# Patient Record
Sex: Female | Born: 1990 | Race: White | Hispanic: No | Marital: Married | Smoking: Never smoker
Health system: Southern US, Community
[De-identification: ages and names within clinical notes are randomized; demographics above are authoritative.]

## PROBLEM LIST (undated history)

## (undated) DIAGNOSIS — N946 Dysmenorrhea, unspecified: Secondary | ICD-10-CM

## (undated) DIAGNOSIS — J45909 Unspecified asthma, uncomplicated: Secondary | ICD-10-CM

## (undated) DIAGNOSIS — N939 Abnormal uterine and vaginal bleeding, unspecified: Secondary | ICD-10-CM

## (undated) DIAGNOSIS — E039 Hypothyroidism, unspecified: Secondary | ICD-10-CM

## (undated) HISTORY — DX: Abnormal uterine and vaginal bleeding, unspecified: N93.9

## (undated) HISTORY — DX: Dysmenorrhea, unspecified: N94.6

## (undated) HISTORY — DX: Unspecified asthma, uncomplicated: J45.909

## (undated) HISTORY — PX: OTHER SURGICAL HISTORY: SHX169

---
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
## 2013-01-25 ENCOUNTER — Encounter: Payer: Self-pay | Admitting: Obstetrics & Gynecology

## 2013-01-25 ENCOUNTER — Ambulatory Visit (INDEPENDENT_AMBULATORY_CARE_PROVIDER_SITE_OTHER): Payer: 59 | Admitting: Obstetrics & Gynecology

## 2013-01-25 VITALS — BP 106/76 | HR 60 | Resp 16 | Ht 68.5 in | Wt 211.8 lb

## 2013-01-25 DIAGNOSIS — Z01419 Encounter for gynecological examination (general) (routine) without abnormal findings: Secondary | ICD-10-CM

## 2013-01-25 DIAGNOSIS — Z124 Encounter for screening for malignant neoplasm of cervix: Secondary | ICD-10-CM

## 2013-01-25 DIAGNOSIS — Z0000 Encounter for general adult medical examination without abnormal findings: Secondary | ICD-10-CM

## 2013-01-25 LAB — POCT URINALYSIS DIPSTICK
Bilirubin, UA: NEGATIVE
Glucose, UA: NEGATIVE
Ketones, UA: NEGATIVE
Leukocytes, UA: NEGATIVE
Nitrite, UA: NEGATIVE
Protein, UA: NEGATIVE
pH, UA: 5

## 2013-01-25 LAB — HEMOGLOBIN, FINGERSTICK: Hemoglobin, fingerstick: 13.8 g/dL (ref 12.0–16.0)

## 2013-01-25 MED ORDER — NORETHINDRONE ACET-ETHINYL EST 1-20 MG-MCG PO TABS: 1.0000 | ORAL_TABLET | Freq: Every day | ORAL | Status: DC

## 2013-01-25 NOTE — Patient Instructions (Signed)

## 2013-01-25 NOTE — Progress Notes (Signed)
22 y.o. G0P0000 SingleCaucasianF here for annual exam.  Has seen Dr. Oliva Bustard years ago--when she was in her teens.  Patient is sexually active and using condoms but she wants to start something for birth control.  Patient's mother had ovarian cysts and fibroids and patient would like to be on ocps in case she has some of the same issues.  Cycles are heavy.  Always have been.  Usually 7-9 days.  Heavy first 3-4 days.  Has clots usually days 1-4.  Uses super tampons.  Uses pads and tampons on heavy days and has to change every 2 hours.  Has been years since she has been on a pill.    Dating same person for 1 1/2 years--only sex partner.  Discussing getting married.  Do want children but she wants to wait several years.    Reports got new job this year.  Felt a lot more stress this year and has gained weight.  Working on this now with diet and exercise.   Patient's last menstrual period was 12/23/2012.          Sexually active: yes  The current method of family planning is condoms most of the time.    Exercising: yes  cardio and weights Smoker:  no  Health Maintenance: Pap:  No history History of abnormal Pap:  no MMG:  none Colonoscopy:  none BMD:   none TDaP:  Up to date Screening Labs: none, Hb today: 13.8, Urine today: negative   reports that she has never smoked. She has never used smokeless tobacco. She reports that she drinks about 0.5 ounces of alcohol per week. She reports that she does not use illicit drugs.  Past Medical History  Diagnosis Date  . Asthma     as a child/situational now    Past Surgical History  Procedure Laterality Date  . Bunionectomy      left  . Tonsillectomy  1998    Current Outpatient Prescriptions  Medication Sig Dispense Refill  . ibuprofen (ADVIL,MOTRIN) 200 MG tablet Take 200 mg by mouth every 6 (six) hours as needed for pain.      . Triamcinolone Acetonide (NASACORT AQ NA) Place into the nose as needed.       No current  facility-administered medications for this visit.    Family History  Problem Relation Age of Onset  . Bipolar disorder Mother   . Bipolar disorder Brother   . Diabetes Maternal Grandmother     prediabetic  . Diabetes Paternal Grandfather   . Fibroids Mother     embedded fibroids and ovarian cyst  . Endometriosis Maternal Grandmother   . Prostate cancer Paternal Grandfather   . Heart disease Paternal Grandfather   . Breast cancer Other     maternal great grandmother  . Thyroid disease Mother   . Hypertension Mother     ?    ROS:  Pertinent items are noted in HPI.  Otherwise, a comprehensive ROS was negative.  Exam:   BP 106/76  Pulse 60  Resp 16  Ht 5' 8.5" (1.74 m)  Wt 211 lb 12.8 oz (96.072 kg)  BMI 31.73 kg/m2  LMP 12/23/2012    Height: 5' 8.5" (174 cm)  Ht Readings from Last 3 Encounters:  01/25/13 5' 8.5" (1.74 m)    General appearance: alert, cooperative and appears stated age Head: Normocephalic, without obvious abnormality, atraumatic Neck: no adenopathy, supple, symmetrical, trachea midline and thyroid normal to inspection and palpation Lungs: clear to auscultation  bilaterally Breasts: normal appearance, no masses or tenderness Heart: regular rate and rhythm Abdomen: soft, non-tender; bowel sounds normal; no masses,  no organomegaly Extremities: extremities normal, atraumatic, no cyanosis or edema Skin: Skin color, texture, turgor normal. No rashes or lesions Lymph nodes: Cervical, supraclavicular, and axillary nodes normal. No abnormal inguinal nodes palpated Neurologic: Grossly normal   Pelvic: External genitalia:  no lesions              Urethra:  normal appearing urethra with no masses, tenderness or lesions              Bartholins and Skenes: normal                 Vagina: normal appearing vagina with normal color and discharge, no lesions              Cervix: no lesions              Pap taken: yes Bimanual Exam:  Uterus:  normal size, contour,  position, consistency, mobility, non-tender              Adnexa: normal adnexa and no mass, fullness, tenderness               Rectovaginal: Confirms               Anus:  normal sphincter tone, no lesions  A:  Well Woman with normal exam Menorrhagia Desires starting OCPs  P:   Mammogram starting at age 54 pap smear only today TSH Will start junel 1/20.  Rx to pharmacy for three months supply.  Side effects of headache, nausea, elevated BP, DVT/PE all discussed. Recheck 3 months after starting ocps. return annually or prn  An After Visit Summary was printed and given to the patient.

## 2013-01-26 LAB — IPS PAP TEST WITH REFLEX TO HPV

## 2013-02-21 ENCOUNTER — Telehealth: Payer: Self-pay | Admitting: Obstetrics & Gynecology

## 2013-02-21 NOTE — Telephone Encounter (Signed)
Patient states has had cycle 6-7 days of normal cycle to wear a tampon and now a brownish color of discharge enough to wear a panty liner. Patient concerned is this pill strong enough for her? Patient also concerned the pills she was given only had 21days of pills and no sugar pills like she had thought you said would have 7 days of pills. Asking if she could continue to start a new pack of 21 days and not wait for the 7 days to start a new pack of pills?

## 2013-02-21 NOTE — Telephone Encounter (Signed)
Patient is experiencing side effects from her new birth control that she started a month ago. She has been bleeding for 12 days. Wants to know if she should continue use? She is currently taking Gildess 120.

## 2013-02-21 NOTE — Telephone Encounter (Signed)
Patient notified of Dr. Hyacinth Meeker response and patient voiced understanding of this.

## 2013-02-21 NOTE — Telephone Encounter (Signed)
Some generics are packaged the way she described--21 active days only.  She is supposed to then stop for 7 days.  21 days of pills/7 days off or of placebo--whatever is in the pack.  She should not run the packs together yet as we have not discussed doing this.  It takes about three months to adjust to a new pill.  She should have follow up in three months.  If bleeding is still irregular, I will change her pills then.

## 2013-05-06 ENCOUNTER — Other Ambulatory Visit: Payer: Self-pay | Admitting: *Deleted

## 2013-05-06 ENCOUNTER — Ambulatory Visit (INDEPENDENT_AMBULATORY_CARE_PROVIDER_SITE_OTHER): Payer: 59 | Admitting: Obstetrics & Gynecology

## 2013-05-06 VITALS — BP 118/70 | HR 60 | Resp 16 | Ht 68.25 in | Wt 217.8 lb

## 2013-05-06 DIAGNOSIS — N938 Other specified abnormal uterine and vaginal bleeding: Secondary | ICD-10-CM

## 2013-05-06 DIAGNOSIS — N920 Excessive and frequent menstruation with regular cycle: Secondary | ICD-10-CM

## 2013-05-06 DIAGNOSIS — N949 Unspecified condition associated with female genital organs and menstrual cycle: Secondary | ICD-10-CM

## 2013-05-06 MED ORDER — MISOPROSTOL 200 MCG PO TABS: ORAL_TABLET | ORAL | Status: DC

## 2013-05-06 NOTE — Patient Instructions (Addendum)
Arrive about 10-15 minutes before appointment

## 2013-05-06 NOTE — Progress Notes (Signed)
22 y.o. Single Caucasian female G0P0000 here for follow up after starting Loestrin 1/20.  She had 19 days of bleeding with the first two months.  The third month, her cycle was normal until she missed one pill and then she started having bleeding and is still bleeding.  It is not heavy but completely annoying for her.  She is frustrated by this and has three questions:  Is she on the correct hormone dose?  What about an IUD?  Her mother has hx of fibroids so she she be concerned?  She is engaged and is getting married in March.  She does want good birth control and is not contemplating pregnancy for several years.    We discussed several options.  D/W pt that cycle control is ususally estrogen related so increasing dosage is appropriate.  Skyla and Mirena IUDs discussed including placement, risks/benefits, length of time of use, cycle improvement.  Pt's cycle are regular off OCPs but heavy so progesterone IUD would be good choice.  Also discussed doing PUS prior to U/S placement to evaluate for fibroids.  Pt interested in IUD and PUS.  She will do some research but she thinks IUD is best option for her.  O: Healthy WD,WN female Affect: normal No physical exam today  Assessment:  DUB on OCPs, desires change in [facility] choice Family hx of fibroids  P: Pt will stop pills now and have withdrawal cycle Plan PUS next week with Mirena/Skyla placement Pt will use cytotec pm of day before and am day of procedure.  Plan paracervical block as well.  Rx to pharmacy.  ~15 minutes spent with patient >50% of time was in face to face discussion of above.

## 2013-05-08 ENCOUNTER — Encounter: Payer: Self-pay | Admitting: Obstetrics & Gynecology

## 2013-05-08 DIAGNOSIS — N938 Other specified abnormal uterine and vaginal bleeding: Secondary | ICD-10-CM | POA: Insufficient documentation

## 2013-05-10 ENCOUNTER — Ambulatory Visit (INDEPENDENT_AMBULATORY_CARE_PROVIDER_SITE_OTHER): Payer: 59 | Admitting: Obstetrics & Gynecology

## 2013-05-10 ENCOUNTER — Ambulatory Visit (INDEPENDENT_AMBULATORY_CARE_PROVIDER_SITE_OTHER): Payer: 59

## 2013-05-10 ENCOUNTER — Other Ambulatory Visit: Payer: Self-pay | Admitting: *Deleted

## 2013-05-10 ENCOUNTER — Other Ambulatory Visit: Payer: Self-pay | Admitting: Obstetrics & Gynecology

## 2013-05-10 DIAGNOSIS — Z30431 Encounter for routine checking of intrauterine contraceptive device: Secondary | ICD-10-CM

## 2013-05-10 DIAGNOSIS — N938 Other specified abnormal uterine and vaginal bleeding: Secondary | ICD-10-CM

## 2013-05-10 DIAGNOSIS — N949 Unspecified condition associated with female genital organs and menstrual cycle: Secondary | ICD-10-CM

## 2013-05-10 DIAGNOSIS — N925 Other specified irregular menstruation: Secondary | ICD-10-CM

## 2013-05-10 DIAGNOSIS — Z309 Encounter for contraceptive management, unspecified: Secondary | ICD-10-CM

## 2013-05-10 DIAGNOSIS — Z30430 Encounter for insertion of intrauterine contraceptive device: Secondary | ICD-10-CM

## 2013-05-10 DIAGNOSIS — IMO0001 Reserved for inherently not codable concepts without codable children: Secondary | ICD-10-CM

## 2013-05-10 NOTE — Progress Notes (Signed)
22 y.o. G0 SWF here for a pelvic ultrasound due to her concerns about family hx of fibroids as well as significant DUB after starting OCPs.  Planning IUD placement today if ultrasound is normal.     Patient's last menstrual period was 05/02/2013.  Sexually active:  yes  Contraception: oral contraceptives (estrogen/progesterone)  FINDINGS: UTERUS: 6.9 x 3.2 x 3.0cm.  No fibroids. EMS: 3.75mm ADNEXA:   Left ovary 2.8 x 1.5 x.1.4cm   Right ovary 3.6 x 2.4 x 2.0cm CUL DE SAC: neg  Reviewed images and results with pt.  As ultrasound is normal, feel IUD placement appropriate.  Pt would like to have the [facility] IUD as this one will be more likely for her to continue to cycle normally.  Consent obtained.  Risks/benefits discussed.    Procedure:  Speculum placed.  Cervix cleansed with Betadine x 3.  Paracervical block with 1% Lidocaine placed.  10 cc's total used.  Anterior lip of cervix grasped with single toothed tenaculum.  Uterus sounded to 6.5cm.  Skyla IUD with introducer passed to fundus and then pulled back slightly.  IUD released and pushed to fundus.  Introducer removed.  Strings cut to 2.0cm.  Tenaculum removed.  Minimal bleeding noted.  Pt tolerated procedure well.  Reviewed MR conditional status with patient and gave her card provided in package.    Proper placement confirmed with utlrasound.  Assessment:  DUB with OCPs, s/p Skyla placement today. Plan: recheck 6 weeks for IUD recheck  ~15 minutes spent with patient >50% of time was in face to face discussion of ultrasound results and options before proceeding with IUD placement.

## 2013-05-11 ENCOUNTER — Encounter: Payer: Self-pay | Admitting: Obstetrics & Gynecology

## 2013-05-11 NOTE — Patient Instructions (Signed)

## 2013-06-21 ENCOUNTER — Encounter: Payer: Self-pay | Admitting: Obstetrics & Gynecology

## 2013-06-21 ENCOUNTER — Ambulatory Visit (INDEPENDENT_AMBULATORY_CARE_PROVIDER_SITE_OTHER): Payer: 59 | Admitting: Obstetrics & Gynecology

## 2013-06-21 VITALS — BP 120/80 | HR 78 | Resp 16 | Ht 68.5 in | Wt 224.6 lb

## 2013-06-21 DIAGNOSIS — Z30431 Encounter for routine checking of intrauterine contraceptive device: Secondary | ICD-10-CM

## 2013-06-21 MED ORDER — PHENTERMINE HCL 15 MG PO CAPS: 15.0000 mg | ORAL_CAPSULE | ORAL | Status: DC

## 2013-06-21 NOTE — Progress Notes (Signed)
22 yrs Caucasian Single G0P0000 here for IUD recheck.  Spotted for about daily after IUD placement.  Cycle was 05/30/13 and lasted 7 days.  This is pretty normal for patient.  No spotting since.  Fiance cannot feel string.  Pt reports no pain with intercourse.  Patient reports history of binge eating d/o about four years ago.  She has never purged.  She has seen a counselor and she hasn't done this in several years. Worked on weight loss and exercise and got down to 165.  She is very happy with boyfriend/fiance.  Knows she is eating out more than before.  She is exercising again.  Has gained over 60 pounds in over 2 years.  TSH normal in 6/14--in EPIC.  HPI neg.  Exam:  Gen:  WNWD, WF, NAD Abdomen: soft non-tender Groin:no inguinal nodes palpated    Pelvic exam:Pelvic exam: normal external genitalia, vulva, vagina, cervix, uterus and adnexa, CERVIX: normal appearing cervix without discharge or lesions, 2cm IUD string noted.  No CMT.   Assessment: IUD recheck Obesity, desires weight loss  Plan: Phentermine 15mg  daily.  D/w pt risks of hypertension, headache, insomnia, pulmonary hypertension Recheck 4 weeks  ~15 minutes spent with patient >50% of time was in face to face discussion of above.

## 2013-06-23 ENCOUNTER — Other Ambulatory Visit: Payer: Self-pay

## 2013-07-19 ENCOUNTER — Encounter: Payer: Self-pay | Admitting: Obstetrics & Gynecology

## 2013-07-19 ENCOUNTER — Telehealth: Payer: Self-pay | Admitting: Obstetrics & Gynecology

## 2013-07-21 NOTE — Telephone Encounter (Signed)
Left patient a message that I will check with Dr Hyacinth Meeker and get RX called in, but she will need to call back to schedule f/u with Dr Hyacinth Meeker sometime in December.

## 2013-07-21 NOTE — Telephone Encounter (Signed)
Pt states she talked with Dr. Hyacinth Meeker on my chart and was told a pres would be called into the pharmacy two days ago.

## 2013-07-29 ENCOUNTER — Telehealth: Payer: Self-pay

## 2013-07-29 NOTE — Telephone Encounter (Signed)
Message copied by Elisha Headland on Fri Jul 29, 2013  4:18 PM ------      Message from: Jerene Bears      Created: Tue Jul 19, 2013 10:37 PM      Regarding: rx and appt       Tresa Endo,      She needs a weight check/phentermine check sometime in December.  We also need to order her phentermine again.  I am sending you this so you will remind me.  Thanks.            MSM ------

## 2013-07-29 NOTE — Telephone Encounter (Signed)
lmtcb

## 2013-08-17 ENCOUNTER — Other Ambulatory Visit: Payer: Self-pay

## 2013-08-17 MED ORDER — PHENTERMINE HCL 15 MG PO CAPS: 15.0000 mg | ORAL_CAPSULE | ORAL | Status: DC

## 2013-08-17 NOTE — Telephone Encounter (Signed)
Pharmacy states no refill called on 07/21/13-called in today-patient to call back to schedule f/u in 1/15//kn

## 2013-10-28 ENCOUNTER — Telehealth: Payer: Self-pay | Admitting: Obstetrics & Gynecology

## 2013-10-28 NOTE — Telephone Encounter (Signed)
LMTCB. VM confirms first name.

## 2013-10-28 NOTE — Telephone Encounter (Signed)
Patient reports menses was a few days late and lighter than normal.  Has had Skyla for 6 months and is concerned if this is normal.  Reaassured this is normal and [facility] and that cycles may continue to become even lighter to absent.  Patient reports she continues to have random menstrual type cramping and is also concerned if this is normal.  Advised that it is normal as long as nothing more than Motrin can help.  Due to patients concerns, offered OV and patient would like to be seen.  Getting married in 2 weeks and would like to confirm that IUD is in place.  Reports increased vaginal discharge with slight yellow tint and some odor. Denies itching or burning.  OV sched for 10-31-13 with Dr Hyacinth MeekerMiller.  Routing to provider for final review. Patient agreeable to disposition. Will close encounter

## 2013-10-28 NOTE — Telephone Encounter (Signed)
Patient request an appointment to have her iud checked.

## 2013-10-31 ENCOUNTER — Telehealth: Payer: Self-pay | Admitting: Obstetrics & Gynecology

## 2013-10-31 ENCOUNTER — Ambulatory Visit: Payer: 59 | Admitting: Obstetrics & Gynecology

## 2013-10-31 NOTE — Telephone Encounter (Signed)
Patient cancelled her IUD recheck appointment for this afternoon due to being called into work. She rescheduled to 11/04/13 at 8:45 AM.

## 2013-11-04 ENCOUNTER — Encounter: Payer: Self-pay | Admitting: Obstetrics & Gynecology

## 2013-11-04 ENCOUNTER — Ambulatory Visit (INDEPENDENT_AMBULATORY_CARE_PROVIDER_SITE_OTHER): Payer: 59 | Admitting: Obstetrics & Gynecology

## 2013-11-04 ENCOUNTER — Telehealth: Payer: Self-pay | Admitting: Obstetrics & Gynecology

## 2013-11-04 VITALS — BP 113/80 | HR 74 | Resp 18 | Ht 68.5 in | Wt 231.0 lb

## 2013-11-04 DIAGNOSIS — N898 Other specified noninflammatory disorders of vagina: Secondary | ICD-10-CM

## 2013-11-04 DIAGNOSIS — Z30431 Encounter for routine checking of intrauterine contraceptive device: Secondary | ICD-10-CM

## 2013-11-04 MED ORDER — CLINDAMYCIN HCL 300 MG PO CAPS: 300.0000 mg | ORAL_CAPSULE | Freq: Two times a day (BID) | ORAL | Status: DC

## 2013-11-04 MED ORDER — CLINDAMYCIN HCL 300 MG PO CAPS: 300.0000 mg | ORAL_CAPSULE | Freq: Three times a day (TID) | ORAL | Status: DC

## 2013-11-04 NOTE — Progress Notes (Signed)
Subjective:     Patient ID: Felicia Burns, female   DOB: 05/15/1991, 23 y.o.   MRN: [id number]  HPI 23 yo G0 engaged WF here for evaluation of yellowish discharge.  Feels like this is worse since IUD placement.  Having to wear a panty liner because of the discharge.  There is some slight odor.  No pain.  No fevers.  Cycles are much lighter with the IUD.  Flow flashes a little longer.  Total bleeding is 10 days.  Can't feel her strings.  She is getting married next week so just wants to be checked.  She occasionally has a cramp but this is random and doesn't last.    Review of Systems  All other systems reviewed and are negative.       Objective:   Physical Exam  Constitutional: She is oriented to person, place, and time. She appears well-developed and well-nourished.  Abdominal: Soft. Bowel sounds are normal. She exhibits no distension. There is no tenderness.  Genitourinary: Uterus normal. There is no rash, tenderness or lesion on the right labia. There is no rash, tenderness or lesion on the left labia. Cervix exhibits no motion tenderness, no discharge and no friability. Right adnexum displays no mass and no tenderness. Left adnexum displays no mass and no tenderness. No tenderness or bleeding around the vagina. Vaginal discharge found.  2cm IUD string noted.  Small amount of dark vaginal bleeding.  Wet smear obtained.  PH 5.0.  Saline with ++RBCs, +WBCs, no trich, occ clue cells.  KOH +Whiff, -yeast.  Musculoskeletal: Normal range of motion.  Lymphadenopathy:       Right: No inguinal adenopathy present.       Left: No inguinal adenopathy present.  Neurological: She is alert and oriented to person, place, and time.  Skin: Skin is warm and dry.  Psychiatric: She has a normal mood and affect.       Assessment:     IUD recheck Bacterial vaginosis     Plan:     clindmycin 300mg  bid x 7 days.  rx [facility]

## 2013-11-04 NOTE — Telephone Encounter (Signed)
Order refaxed

## 2013-11-04 NOTE — Telephone Encounter (Signed)
[facility] calling to get clarification on clindamycin (CLEOCIN) 300 MG capsule

## 2013-11-04 NOTE — Telephone Encounter (Signed)
Take 1 capsule (300 mg total) by mouth 2 (two) times daily. Take one capsule TID x 7D?

## 2013-11-04 NOTE — Telephone Encounter (Signed)
[facility] calling to get clarification on

## 2013-12-28 ENCOUNTER — Encounter: Payer: Self-pay | Admitting: Obstetrics & Gynecology

## 2014-02-06 NOTE — Telephone Encounter (Signed)
Patient has had follow up appointment.//kn

## 2014-03-10 ENCOUNTER — Ambulatory Visit: Payer: 59 | Admitting: Obstetrics & Gynecology

## 2014-03-17 ENCOUNTER — Ambulatory Visit: Payer: 59 | Admitting: Obstetrics & Gynecology

## 2014-03-17 ENCOUNTER — Encounter: Payer: Self-pay | Admitting: Obstetrics & Gynecology

## 2014-06-27 ENCOUNTER — Encounter: Payer: Self-pay | Admitting: Certified Nurse Midwife

## 2014-06-27 ENCOUNTER — Ambulatory Visit (INDEPENDENT_AMBULATORY_CARE_PROVIDER_SITE_OTHER): Payer: Commercial Managed Care - PPO | Admitting: Certified Nurse Midwife

## 2014-06-27 VITALS — BP 104/64 | HR 68 | Resp 16 | Ht 67.75 in | Wt 258.0 lb

## 2014-06-27 DIAGNOSIS — Z0000 Encounter for general adult medical examination without abnormal findings: Secondary | ICD-10-CM

## 2014-06-27 DIAGNOSIS — Z01419 Encounter for gynecological examination (general) (routine) without abnormal findings: Secondary | ICD-10-CM

## 2014-06-27 LAB — POCT URINALYSIS DIPSTICK
BILIRUBIN UA: NEGATIVE
Glucose, UA: NEGATIVE
KETONES UA: NEGATIVE
LEUKOCYTES UA: NEGATIVE
NITRITE UA: NEGATIVE
PH UA: 5
PROTEIN UA: NEGATIVE
RBC UA: NEGATIVE
Urobilinogen, UA: NEGATIVE

## 2014-06-27 LAB — HEMOGLOBIN, FINGERSTICK: HEMOGLOBIN, FINGERSTICK: 13.2 g/dL (ref 12.0–16.0)

## 2014-06-27 NOTE — Patient Instructions (Signed)
General topics  Next pap or exam is  due in 1 year Take a Women's multivitamin Take 1200 mg. of calcium daily - prefer dietary If any concerns in interim to call back  Breast Self-Awareness Practicing breast self-awareness may pick up problems early, prevent significant medical complications, and possibly save your life. By practicing breast self-awareness, you can become familiar with how your breasts look and feel and if your breasts are changing. This allows you to notice changes early. It can also offer you some reassurance that your breast health is good. One way to learn what is normal for your breasts and whether your breasts are changing is to do a breast self-exam. If you find a lump or something that was not present in the past, it is best to contact your caregiver right away. Other findings that should be evaluated by your caregiver include nipple discharge, especially if it is bloody; skin changes or reddening; areas where the skin seems to be pulled in (retracted); or new lumps and bumps. Breast pain is seldom associated with cancer (malignancy), but should also be evaluated by a caregiver. BREAST SELF-EXAM The best time to examine your breasts is 5 7 days after your menstrual period is over.  ExitCare Patient Information 2013 [employer].   Exercise to Stay Healthy Exercise helps you become and stay healthy. EXERCISE IDEAS AND TIPS Choose exercises that:  You enjoy.  Fit into your day. You do not need to exercise really hard to be healthy. You can do exercises at a slow or medium level and stay healthy. You can:  Stretch before and after working out.  Try yoga, Pilates, or tai chi.  Lift weights.  Walk fast, swim, jog, run, climb stairs, bicycle, dance, or rollerskate.  Take aerobic classes. Exercises that burn about 150 calories:  Running 1  miles in 15 minutes.  Playing volleyball for 45 to 60 minutes.  Washing and waxing a car for 45 to 60  minutes.  Playing touch football for 45 minutes.  Walking 1  miles in 35 minutes.  Pushing a stroller 1  miles in 30 minutes.  Playing basketball for 30 minutes.  Raking leaves for 30 minutes.  Bicycling 5 miles in 30 minutes.  Walking 2 miles in 30 minutes.  Dancing for 30 minutes.  Shoveling snow for 15 minutes.  Swimming laps for 20 minutes.  Walking up stairs for 15 minutes.  Bicycling 4 miles in 15 minutes.  Gardening for 30 to 45 minutes.  Jumping rope for 15 minutes.  Washing windows or floors for 45 to 60 minutes. Document Released: 09/06/2010 Document Revised: 10/27/2011 Document Reviewed: 09/06/2010 ExitCare Patient Information 2013 ExitCare[employer].   Other topics ( that may be useful information):    Sexually Transmitted Disease Sexually transmitted disease (STD) refers to any infection that is passed from person to person during sexual activity. This may happen by way of saliva, semen, blood, vaginal mucus, or urine. Common STDs include:  Gonorrhea.  Chlamydia.  Syphilis.  HIV/AIDS.  Genital herpes.  Hepatitis B and C.  Trichomonas.  Human papillomavirus (HPV).  Pubic lice. CAUSES  An STD may be spread by bacteria, virus, or parasite. A person can get an STD by:  Sexual intercourse with an infected person.  Sharing sex toys with an infected person.  Sharing needles with an infected person.  Having intimate contact with the genitals, mouth, or rectal areas of an infected person. SYMPTOMS  Some people may not have any symptoms, but   they can still pass the infection to others. Different STDs have different symptoms. Symptoms include:  Painful or bloody urination.  Pain in the pelvis, abdomen, vagina, anus, throat, or eyes.  Skin rash, itching, irritation, growths, or sores (lesions). These usually occur in the genital or anal area.  Abnormal vaginal discharge.  Penile discharge in men.  Soft, flesh-colored skin growths in the  genital or anal area.  Fever.  Pain or bleeding during sexual intercourse.  Swollen glands in the groin area.  Yellow skin and eyes (jaundice). This is seen with hepatitis. DIAGNOSIS  To make a diagnosis, your caregiver may:  Take a medical history.  Perform a physical exam.  Take a specimen (culture) to be examined.  Examine a sample of discharge under a microscope.  Perform blood test TREATMENT   Chlamydia, gonorrhea, trichomonas, and syphilis can be cured with antibiotic medicine.  Genital herpes, hepatitis, and HIV can be treated, but not cured, with prescribed medicines. The medicines will lessen the symptoms.  Genital warts from HPV can be treated with medicine or by freezing, burning (electrocautery), or surgery. Warts may come back.  HPV is a virus and cannot be cured with medicine or surgery.However, abnormal areas may be followed very closely by your caregiver and may be removed from the cervix, vagina, or vulva through office procedures or surgery. If your diagnosis is confirmed, your recent sexual partners need treatment. This is true even if they are symptom-free or have a negative culture or evaluation. They should not have sex until their caregiver says it is okay. HOME CARE INSTRUCTIONS  All sexual partners should be informed, tested, and treated for all STDs.  Take your antibiotics as directed. Finish them even if you start to feel better.  Only take over-the-counter or prescription medicines for pain, discomfort, or fever as directed by your caregiver.  Rest.  Eat a balanced diet and drink enough fluids to keep your urine clear or pale yellow.  Do not have sex until treatment is completed and you have followed up with your caregiver. STDs should be checked after treatment.  Keep all follow-up appointments, Pap tests, and blood tests as directed by your caregiver.  Only use latex condoms and water-soluble lubricants during sexual activity. Do not use  petroleum jelly or oils.  Avoid alcohol and illegal drugs.  Get vaccinated for HPV and hepatitis. If you have not received these vaccines in the past, talk to your caregiver about whether one or both might be right for you.  Avoid risky sex practices that can break the skin. The only way to avoid getting an STD is to avoid all sexual activity.Latex condoms and dental dams (for oral sex) will help lessen the risk of getting an STD, but will not completely eliminate the risk. SEEK MEDICAL CARE IF:   You have a fever.  You have any new or worsening symptoms. Document Released: 10/25/2002 Document Revised: 10/27/2011 Document Reviewed: 11/01/2010 [facility] Patient Information 2013 [place].    Domestic Abuse You are being battered or abused if someone close to you hits, pushes, or physically hurts you in any way. You also are being abused if you are forced into activities. You are being sexually abused if you are forced to have sexual contact of any kind. You are being emotionally abused if you are made to feel worthless or if you are constantly threatened. It is important to remember that help is available. No one has the right to abuse you. PREVENTION OF FURTHER  ABUSE  Learn the warning signs of danger. This varies with situations but may include: the use of alcohol, threats, isolation from friends and family, or forced sexual contact. Leave if you feel that violence is going to occur.  If you are attacked or beaten, report it to the police so the abuse is documented. You do not have to press charges. The police can protect you while you or the attackers are leaving. Get the officer's name and badge number and a copy of the report.  Find someone you can trust and tell them what is happening to you: your caregiver, a nurse, clergy member, close friend or family member. Feeling ashamed is natural, but remember that you have done nothing wrong. No one deserves abuse. Document Released:  08/01/2000 Document Revised: 10/27/2011 Document Reviewed: 10/10/2010 ExitCare Patient Information 2013 ExitCare[employer].    How Much is Too Much Alcohol? Drinking too much alcohol can cause injury, accidents, and health problems. These types of problems can include:   Car crashes.  Falls.  Family fighting (domestic violence).  Drowning.  Fights.  Injuries.  Burns.  Damage to certain organs.  Having a baby with birth defects. ONE DRINK CAN BE TOO MUCH WHEN YOU ARE:  Working.  Pregnant or breastfeeding.  Taking medicines. Ask your doctor.  Driving or planning to drive. If you or someone you know has a drinking problem, get help from a doctor.  Document Released: 05/31/2009 Document Revised: 10/27/2011 Document Reviewed: 05/31/2009 ExitCare Patient Information 2013 ExitCare[employer].   Smoking Hazards Smoking cigarettes is extremely bad for your health. Tobacco smoke has over 200 known poisons in it. There are over 60 chemicals in tobacco smoke that cause cancer. Some of the chemicals found in cigarette smoke include:   Cyanide.  Benzene.  Formaldehyde.  Methanol (wood alcohol).  Acetylene (fuel used in welding torches).  Ammonia. Cigarette smoke also contains the poisonous gases nitrogen oxide and carbon monoxide.  Cigarette smokers have an increased risk of many serious medical problems and Smoking causes approximately:  90% of all lung cancer deaths in men.  80% of all lung cancer deaths in women.  90% of deaths from chronic obstructive lung disease. Compared with nonsmokers, smoking increases the risk of:  Coronary heart disease by 2 to 4 times.  Stroke by 2 to 4 times.  Men developing lung cancer by 23 times.  Women developing lung cancer by 13 times.  Dying from chronic obstructive lung diseases by 12 times.  . Smoking is the most preventable cause of death and disease in our society.  WHY IS SMOKING ADDICTIVE?  Nicotine is the chemical  agent in tobacco that is capable of causing addiction or dependence.  When you smoke and inhale, nicotine is absorbed rapidly into the bloodstream through your lungs. Nicotine absorbed through the lungs is capable of creating a powerful addiction. Both inhaled and non-inhaled nicotine may be addictive.  Addiction studies of cigarettes and spit tobacco show that addiction to nicotine occurs mainly during the teen years, when young people begin using tobacco products. WHAT ARE THE BENEFITS OF QUITTING?  There are many health benefits to quitting smoking.   Likelihood of developing cancer and heart disease decreases. Health improvements are seen almost immediately.  Blood pressure, pulse rate, and breathing patterns start returning to normal soon after quitting. QUITTING SMOKING   American Lung Association - 1-800-LUNGUSA  American Cancer Society - 1-800-ACS-2345 Document Released: 09/11/2004 Document Revised: 10/27/2011 Document Reviewed: 05/16/2009 ExitCare Patient Information 2013 ExitCare,   LLC.   Stress Management Stress is a state of physical or mental tension that often results from changes in your life or normal routine. Some common causes of stress are:  Death of a loved one.  Injuries or severe illnesses.  Getting fired or changing jobs.  Moving into a new home. Other causes may be:  Sexual problems.  Business or financial losses.  Taking on a large debt.  Regular conflict with someone at home or at work.  Constant tiredness from lack of sleep. It is not just bad things that are stressful. It may be stressful to:  Win the lottery.  Get married.  Buy a new car. The amount of stress that can be easily tolerated varies from person to person. Changes generally cause stress, regardless of the types of change. Too much stress can affect your health. It may lead to physical or emotional problems. Too little stress (boredom) may also become stressful. SUGGESTIONS TO  REDUCE STRESS:  Talk things over with your family and friends. It often is helpful to share your concerns and worries. If you feel your problem is serious, you may want to get help from a professional counselor.  Consider your problems one at a time instead of lumping them all together. Trying to take care of everything at once may seem impossible. List all the things you need to do and then start with the most important one. Set a goal to accomplish 2 or 3 things each day. If you expect to do too many in a single day you will naturally fail, causing you to feel even more stressed.  Do not use alcohol or drugs to relieve stress. Although you may feel better for a short time, they do not remove the problems that caused the stress. They can also be habit forming.  Exercise regularly - at least 3 times per week. Physical exercise can help to relieve that "uptight" feeling and will relax you.  The shortest distance between despair and hope is often a good night's sleep.  Go to bed and get up on time allowing yourself time for appointments without being rushed.  Take a short "time-out" period from any stressful situation that occurs during the day. Close your eyes and take some deep breaths. Starting with the muscles in your face, tense them, hold it for a few seconds, then relax. Repeat this with the muscles in your neck, shoulders, hand, stomach, back and legs.  Take good care of yourself. Eat a balanced diet and get plenty of rest.  Schedule time for having fun. Take a break from your daily routine to relax. HOME CARE INSTRUCTIONS   Call if you feel overwhelmed by your problems and feel you can no longer manage them on your own.  Return immediately if you feel like hurting yourself or someone else. Document Released: 01/28/2001 Document Revised: 10/27/2011 Document Reviewed: 09/20/2007 [facility] Patient Information 2013 [place].  Exercise to Lose Weight Exercise and a healthy diet  may help you lose weight. Your doctor may suggest specific exercises. EXERCISE IDEAS AND TIPS  Choose low-cost things you enjoy doing, such as walking, bicycling, or exercising to workout videos.  Take stairs instead of the elevator.  Walk during your lunch break.  Park your car further away from work or school.  Go to a gym or an exercise class.  Start with 5 to 10 minutes of exercise each day. Build up to 30 minutes of exercise 4 to 6 days a week.  Wear shoes with good support and comfortable clothes.  Stretch before and after working out.  Work out until you breathe harder and your heart beats faster.  Drink extra water when you exercise.  Do not do so much that you hurt yourself, feel dizzy, or get very short of breath. Exercises that burn about 150 calories:  Running 1  miles in 15 minutes.  Playing volleyball for 45 to 60 minutes.  Washing and waxing a car for 45 to 60 minutes.  Playing touch football for 45 minutes.  Walking 1  miles in 35 minutes.  Pushing a stroller 1  miles in 30 minutes.  Playing basketball for 30 minutes.  Raking leaves for 30 minutes.  Bicycling 5 miles in 30 minutes.  Walking 2 miles in 30 minutes.  Dancing for 30 minutes.  Shoveling snow for 15 minutes.  Swimming laps for 20 minutes.  Walking up stairs for 15 minutes.  Bicycling 4 miles in 15 minutes.  Gardening for 30 to 45 minutes.  Jumping rope for 15 minutes.  Washing windows or floors for 45 to 60 minutes. Document Released: 09/06/2010 Document Revised: 10/27/2011 Document Reviewed: 09/06/2010 [facility] Patient Information 2015 [place], [place]. This information is not intended to replace advice given to you by your health care provider. Make sure you discuss any questions you have with your health care provider.

## 2014-06-27 NOTE — Progress Notes (Signed)
Reviewed personally.  M. Suzanne Kaelie Henigan, MD.  

## 2014-06-27 NOTE — Progress Notes (Signed)
23 y.o. G0P0000 Single Caucasian Fe here for annual exam. Periods normal, no issues with lighter flow. Contraception Skyla IUD working well, with out problems. Patient interested in weight loss. Working with Juice plus shakes which is helping. Patient does not want to be like the rest of the family who had gastric bypass to lose weight. She has lost over a hundred pounds before and feels she can do it again without medication. Jogging with spouse 2 x weekly and plans to increase. Sees PCP prn. No other health issues today.  Patient's last menstrual period was 06/26/2014.          Sexually active: Yes.    The current method of family planning is IUD.    Exercising: Yes.    cardio Smoker:  no  Health Maintenance: Pap: 01-25-13 neg MMG:  none Colonoscopy:  none BMD:   none TDaP:  2008 Labs: Poct urine-neg, Hgb-13.2 Self breast exam: done occ   reports that she has never smoked. She has never used smokeless tobacco. She reports that she drinks about 1.8 oz of alcohol per week. She reports that she does not use illicit drugs.  Past Medical History  Diagnosis Date  . Asthma     as a child/situational now    Past Surgical History  Procedure Laterality Date  . Bunionectomy  2002    left  . Tonsillectomy  1998    Current Outpatient Prescriptions  Medication Sig Dispense Refill  . fluticasone (FLONASE) 50 MCG/ACT nasal spray as needed.    Marland Kitchen. ibuprofen (ADVIL,MOTRIN) 200 MG tablet Take 200 mg by mouth every 6 (six) hours as needed for pain.    . Levonorgestrel (SKYLA) 13.5 MG IUD by Intrauterine route.     No current facility-administered medications for this visit.    Family History  Problem Relation Age of Onset  . Bipolar disorder Mother   . Bipolar disorder Brother   . Diabetes Maternal Grandmother     prediabetic  . Diabetes Paternal Grandfather   . Fibroids Mother     embedded fibroids and ovarian cyst  . Endometriosis Maternal Grandmother   . Prostate cancer Paternal  Grandfather   . Heart disease Paternal Grandfather   . Breast cancer Other     maternal great grandmother  . Thyroid disease Mother   . Hypertension Mother     ?    ROS:  Pertinent items are noted in HPI.  Otherwise, a comprehensive ROS was negative.  Exam:   BP 104/64 mmHg  Pulse 68  Resp 16  Ht 5' 7.75" (1.721 m)  Wt 258 lb (117.028 kg)  BMI 39.51 kg/m2  LMP 06/26/2014 Height: 5' 7.75" (172.1 cm)  Ht Readings from Last 3 Encounters:  06/27/14 5' 7.75" (1.721 m)  11/04/13 5' 8.5" (1.74 m)  06/21/13 5' 8.5" (1.74 m)    General appearance: alert, cooperative and appears stated age Head: Normocephalic, without obvious abnormality, atraumatic Neck: no adenopathy, supple, symmetrical, trachea midline and thyroid normal to inspection and palpation Lungs: clear to auscultation bilaterally Breasts: normal appearance, no masses or tenderness, No nipple retraction or dimpling, No nipple discharge or bleeding, No axillary or supraclavicular adenopathy Heart: regular rate and rhythm Abdomen: soft, non-tender; no masses,  no organomegaly Extremities: extremities normal, atraumatic, no cyanosis or edema Skin: Skin color, texture, turgor normal. No rashes or lesions Lymph nodes: Cervical, supraclavicular, and axillary nodes normal. No abnormal inguinal nodes palpated Neurologic: Grossly normal   Pelvic: External genitalia:  no lesions  Urethra:  normal appearing urethra with no masses, tenderness or lesions              Bartholin's and Skene's: normal                 Vagina: normal appearing vagina with normal color and discharge, no lesions              Cervix: non tender normal, no lesions IUD string noted in cervix              Pap taken: Yes.   Bimanual Exam:  Uterus:  normal size, contour, position, consistency, mobility, non-tender and anteverted              Adnexa: normal adnexa and no mass, fullness, tenderness               Rectovaginal: Confirms                Anus:  Normal appearance  A:  Well Woman with normal exam  Contraception Skyla IUD  Obese  P:   Reviewed health and wellness pertinent to exam  Reviewed warning signs of IUD and need to advise  Discussed weight loss options and encouraged to work with weight by eating heavier meal in am and decreasing the evening meal. Eliminate all white in diet and work on complex carbohydrates and protein. Consider work out [employer] with Psychologist, educationaltrainer.Encouraged to seek spouse support. Patient feels she can do this. Encouraged to keep in touch with us with progress or if help needed. Encouraged to continue Juice plus shakes for in between snacks which should help.  Pap smear not taken today  counseled on breast self exam, adequate intake of calcium and vitamin D, diet and exercise return annually or prn  An After Visit Summary was printed and given to the patient.

## 2015-02-18 ENCOUNTER — Telehealth: Payer: Self-pay | Admitting: Obstetrics and Gynecology

## 2015-02-18 NOTE — Telephone Encounter (Signed)
Phone call returned to patient on February 18, 2015.  [facility] and calling with vulvar numbness.  Has Skyla IUD inserted September 2014.  Symptoms started in perianal area a few days ago. Pinches her vulvar area and cannot feel the sensation well.  Denies pain.  States she has some decreased sensation over her shins but not on her thighs.  Has been walking a lot. No frank falls. Some pressure in her head but not really headaches.  No change in her speech.  Urinary stress incontinence she attributes to her extra weight.  No loss of bowel control.  I recommended patient call the office on February 20, 2015 if symptoms persist, and we can see her for a visit and then refer to neurology as appropriate. She states she does not have a PCP or know of one.  If symptoms progress, go to local emergency department.  Patient indicates understanding and agrees.

## 2015-03-07 ENCOUNTER — Telehealth: Payer: Self-pay | Admitting: Certified Nurse Midwife

## 2015-03-07 NOTE — Telephone Encounter (Signed)
Patient calling requesting to schedule IUD removal and a consultation for birth control. She has new insurance and will call later with the information.

## 2015-03-07 NOTE — Telephone Encounter (Signed)
Return call to patient, left message to call back. 

## 2015-03-19 NOTE — Telephone Encounter (Signed)
Follow-up call to patient. Left message to call back. Can speak to any triage nurse.

## 2015-03-28 NOTE — Telephone Encounter (Signed)
Message left to return call to Felicia Burns at [phone].    

## 2015-04-02 NOTE — Telephone Encounter (Signed)
Dr.Miller,  We have attempted to reach patient three times without response.  Okay to close encounter?

## 2015-04-02 NOTE — Telephone Encounter (Signed)
OK to close encounter. Thanks.

## 2015-05-09 ENCOUNTER — Telehealth: Payer: Self-pay | Admitting: Certified Nurse Midwife

## 2015-05-09 DIAGNOSIS — Z30432 Encounter for removal of intrauterine contraceptive device: Secondary | ICD-10-CM

## 2015-05-09 NOTE — Addendum Note (Signed)
Addended by: Jannet Askew on: 05/09/2015 04:31 PM   Modules accepted: Orders

## 2015-05-09 NOTE — Telephone Encounter (Signed)
Spoke with patient. Patient state that she would like to have her IUD removed this week. "I have had it for 2 years and I keep having changes with my ph. I also feel like it is making me gain weight even though they say that it cant. I have gained 60 pounds since having it and have been dieting and exercising without results. I just want to have it out to see if it makes a difference." Appointment scheduled for tomorrow at 2 pm with Leota Sauers CNM. Agreeable to date and time. States that she has new insurance coverage. Asked patient to fax new insurance card to office for pre-certification. Patient states she does not have accessibility to a fax machine. Offered to move appointment until this information can be obtained as this will determine her cost. Patient declines. Will try to fax prior to appointment. Advised I will also send a message to billing to see if there is something else that can be done to obtain information.   Cc: Ellis Savage  Routing to provider for final review. Patient agreeable to disposition. Will close encounter.

## 2015-05-09 NOTE — Telephone Encounter (Signed)
Patient would like to have her iud removed. °

## 2015-05-10 ENCOUNTER — Encounter: Payer: Self-pay | Admitting: Certified Nurse Midwife

## 2015-05-10 ENCOUNTER — Ambulatory Visit (INDEPENDENT_AMBULATORY_CARE_PROVIDER_SITE_OTHER): Payer: BLUE CROSS/BLUE SHIELD | Admitting: Certified Nurse Midwife

## 2015-05-10 DIAGNOSIS — Z30432 Encounter for removal of intrauterine contraceptive device: Secondary | ICD-10-CM | POA: Diagnosis not present

## 2015-05-10 NOTE — Patient Instructions (Signed)
Contraception Choices  Birth control (contraception) is the use of any methods or devices to stop pregnancy from happening. Below are some methods to help avoid pregnancy.  HORMONAL BIRTH CONTROL  · A small tube put under the skin of the upper arm (implant). The tube can stay in place for 3 years. The implant must be taken out after 3 years.  · Shots given every 3 months.  · Pills taken every day.  · Patches that are changed once a week.  · A ring put into the vagina (vaginal ring). The ring is left in place for 3 weeks and removed for 1 week. Then, a new ring is put in the vagina.  · Emergency birth control pills taken after unprotected sex (intercourse).  BARRIER BIRTH CONTROL   · A thin covering worn on the penis (female condom) during sex.  · A soft, loose covering put into the vagina (female condom) before sex.  · A rubber bowl that sits over the cervix (diaphragm). The bowl must be made for you. The bowl is put into the vagina before sex. The bowl is left in place for 6 to 8 hours after sex.  · A small, soft cup that fits over the cervix (cervical cap). The cup must be made for you. The cup can be left in place for 48 hours after sex.  · A sponge that is put into the vagina before sex.  · A chemical that kills or stops sperm from getting into the cervix and uterus (spermicide). The chemical may be a cream, jelly, foam, or pill.  INTRAUTERINE (IUD) BIRTH CONTROL   · IUD birth control is a small, T-shaped piece of plastic. The plastic is put inside the uterus. There are 2 types of IUD:  ¨ Copper IUD. The IUD is covered in copper wire. The copper makes a fluid that kills sperm. It can stay in place for 10 years.  ¨ Hormone IUD. The hormone stops pregnancy from happening. It can stay in place for 5 years.  PERMANENT METHODS  · When the woman has her fallopian tubes sealed, tied, or blocked during surgery. This stops the egg from traveling to the uterus.  · The doctor places a small coil or insert into each fallopian  tube. This causes scar tissue to form and blocks the fallopian tubes.  · When the female has the tubes that carry sperm tied off (vasectomy).  NATURAL FAMILY PLANNING BIRTH CONTROL   · Natural family planning means not having sex or using barrier birth control on the days the woman could become pregnant.  · Use a calendar to keep track of the length of each period and know the days she can get pregnant.  · Avoid sex during ovulation.  · Use a thermometer to measure body temperature. Also watch for symptoms of ovulation.  · Time sex to be after the woman has ovulated.  Use condoms to help protect yourself against sexually transmitted infections (STIs). Do this no matter what type of birth control you use. Talk to your doctor about which type of birth control is best for you.  Document Released: 06/01/2009 Document Revised: 08/09/2013 Document Reviewed: 02/23/2013  ExitCare® Patient Information ©2015 [employer]. This information is not intended to replace advice given to you by your health care provider. Make sure you discuss any questions you have with your health care provider.

## 2015-05-10 NOTE — Progress Notes (Signed)
Reviewed personally.  M. Suzanne Miller, MD.  

## 2015-05-10 NOTE — Progress Notes (Signed)
24 yrsCaucasian Married G0P0000 LMP 05/09/15.      Presents forSkyla removal.  Denies any vaginal symptoms or STD concerns.  Plans for contraception are condoms. Patient currently working on weight loss and has changed jobs to be able to exercise daily. Working with nutritionist which is helping with food choices. No health issues today.  LMP 05/09/15          HPI neg. And pertinent to visit.   Exam:Healthy WDWN obese female Affect: normal, orientation x 3  Abdomen: soft non-tender Groin:no enlarge lymph nodes    Pelvic exam:Pelvic exam: VULVA: normal appearing vulva with no masses, tenderness or lesions,  VAGINA: normal appearing vagina with normal color and discharge, no lesions,  CERVIX: normal appearing cervix without discharge or lesions, IUD noted in cervical os,  UTERUS: uterus is normal size, shape, consistency and nontender, exam limited by body habitus,  ADNEXA: normal adnexa in size, nontender and no masses, limited by body habitus.  Procedure: Speculum placed, cervix visualized.  IUD string visualized, grasp with straight forceps, with gentle traction IUD removed intact.  IUD shown to patient and discarded. Speculum removed.   Assessment:Skyla removal IUD intact Normal pelvic exam limited by body habitus Pt tolerated procedure well.  Plan: Begin contraceptive choice of  condoms . Discussed importance of consistent use for protection.  Encouraged to continue weight loss program. Questions addressed  Return Visit aex, prn

## 2015-07-04 ENCOUNTER — Ambulatory Visit: Payer: BLUE CROSS/BLUE SHIELD | Admitting: Certified Nurse Midwife

## 2015-09-04 ENCOUNTER — Telehealth: Payer: Self-pay | Admitting: Certified Nurse Midwife

## 2015-09-04 ENCOUNTER — Ambulatory Visit (INDEPENDENT_AMBULATORY_CARE_PROVIDER_SITE_OTHER): Payer: BLUE CROSS/BLUE SHIELD | Admitting: Obstetrics & Gynecology

## 2015-09-04 ENCOUNTER — Encounter: Payer: Self-pay | Admitting: Obstetrics & Gynecology

## 2015-09-04 VITALS — BP 116/74 | HR 82 | Resp 16 | Wt 270.0 lb

## 2015-09-04 DIAGNOSIS — N926 Irregular menstruation, unspecified: Secondary | ICD-10-CM

## 2015-09-04 DIAGNOSIS — N970 Female infertility associated with anovulation: Secondary | ICD-10-CM

## 2015-09-04 LAB — POCT URINE PREGNANCY: Preg Test, Ur: NEGATIVE

## 2015-09-04 MED ORDER — NORETHINDRONE ACET-ETHINYL EST 1.5-30 MG-MCG PO TABS: ORAL_TABLET | ORAL | Status: DC

## 2015-09-04 NOTE — Telephone Encounter (Signed)
Patient had iud taken out in September and has been bleeding about 80% of the time since. No chart

## 2015-09-04 NOTE — Telephone Encounter (Signed)
Spoke with patient. Patient states that she had her IUD removed in 04/2015. In October she had a light cycle. Since November 2016 she has had consistent bleeding with only 5 days without any bleeding at all. "I would say that half of the time it is spotting and the other half is clotting and bleeding like my cycle." Is experiencing increased fatigue. Denies any SOB or dizziness. Advised she will need to be seen in the office for further evaluation and to check hemoglobin level. Patient is agreeable. Appointment scheduled for today 09/04/2015 at 3 pm with Dr.Miller.  Routing to provider for final review. Patient agreeable to disposition. Will close encounter.

## 2015-09-04 NOTE — Patient Instructions (Signed)
Take three tablets of the OCP (morning, noon, night) until your bleeding stops.  If this hasn't happened in three days, you need to call.  When it does stop, decrease to two tablets a day (morning and night) for two days and then decrease to one a day.  You will let me know when your bleeding stops so I can advise you whether to go into a second pack and skip the placebo pills or just finish the first pack normally.

## 2015-09-04 NOTE — Progress Notes (Signed)
Subjective:     Patient ID: Felicia Burns, female   DOB: 09/23/1990, 25 y.o.   MRN: [id number]  HPI 25 yo G0 MWF here for complaint of irregular bleeding that started when her Christean Grief IUD was removed 05/10/15.  Pt reports after the IUD was removed, she had light bleeding for about 4 days.  Pt had a normal cycle for about 4-5 days in mid-October.  Then in early November, she started having episodic bleeding of heavy bleeding for two to three days.  Bleeding will occur for two to three days, then it tapers for up to a week where is almost nothing for a few days and then it starts all over again.  This has been ongoing for about 10 weeks.  Pt reports waking up with heavier bleeding this morning and she decided to call and be seen.    Denies lightheaded feeling or dizziness.  Not eating ice.  Energy good.  She questions thyroid issues.  No pelvic pain.  No vaginal discharge. No new sexual partner.  No voiding issues/changes.  Last pap:  6/14 and negative.  Aware repeat due this June.  Review of Systems  All other systems reviewed and are negative.      Objective:   Physical Exam  Constitutional: She is oriented to person, place, and time. She appears well-developed and well-nourished.  Neck: Normal range of motion. Neck supple.  Cardiovascular: Normal rate and regular rhythm.   Pulmonary/Chest: Effort normal and breath sounds normal.  Abdominal: Soft. Bowel sounds are normal.  Genitourinary:  Declines pelvic exam due to the amount of bleeding today.  Neurological: She is alert and oriented to person, place, and time.  Skin: Skin is warm and dry.  Psychiatric: She has a normal mood and affect.       Assessment:     DUB with episodes of menorrhagia  Obesity, not interested in any weight loss programs/surgical interventions    Plan:     OCP taper with Loestrin 1.5/30 initiated.  (pharmacy called for pt to ensure low cost of pills).  Pt will take 3 tabs for three days (and call if  bleeding hasn't stopped in three days).  If bleeding stops, she can decrease to two tabs for two days and then one tab daily.  Pt will then finish pack, skip placebo pills, and go into another pack of pills so that she gets several weeks without bleeding.  Risks discussed of headache, nausea, DVT/PE.  Aware to call with any problems.  Rx to pharmacy.   TSH with panel and Hb will be obtained.

## 2015-09-05 LAB — THYROID PANEL WITH TSH
FREE THYROXINE INDEX: 2.2 (ref 1.4–3.8)
T3 UPTAKE: 28 % (ref 22–35)
T4 TOTAL: 8 ug/dL (ref 4.5–12.0)
TSH: 3.792 u[IU]/mL (ref 0.350–4.500)

## 2015-09-10 LAB — HEMOGLOBIN, FINGERSTICK: HEMOGLOBIN, FINGERSTICK: 12.5 g/dL (ref 12.0–16.0)

## 2015-09-11 ENCOUNTER — Telehealth: Payer: Self-pay | Admitting: Obstetrics & Gynecology

## 2015-09-11 DIAGNOSIS — N926 Irregular menstruation, unspecified: Secondary | ICD-10-CM

## 2015-09-11 NOTE — Telephone Encounter (Signed)
Patient called and said, "I started bleeding this morning after cutting back to one pill yesterday. I was told to call with an update and need some guidance on what to do now."  No paper chart in file.

## 2015-09-11 NOTE — Telephone Encounter (Signed)
Spoke with patient. Patient was seen on 09/04/2015 with Dr.Mileler for evaluation of irregular bleeding since IUD removal 05/10/2015. Patient was advised to take Loestrin 1.5/30 three tablets (morning, noon, night) until her bleeding stops. Patient did this for two days and bleeding stopped. Decreased to two tablets per day (morning and night) for two days with no bleeding. Decreased to taking 1 pill yesterday and woke up this morning with "heavy" bleeding when she went to the restroom. "It has tapered to normal bleeding as the day has gone on. I was calling to see what I need to do now." Advised patient I will provide Dr.Miller with an update and return call with further recommendations. Patient is agreeable.

## 2015-09-11 NOTE — Telephone Encounter (Signed)
Spoke with patient. Advised of message as seen below from Dr.Miller. Patient is agreeable and verbalizes understanding. Appointment for PUS scheduled for 09/13/2015 at 3:30 pm with 4 pm consult with Dr.Miller. Order placed for precert.  Cc; Harland Dingwall  Routing to provider for final review. Patient agreeable to disposition. Will close encounter.

## 2015-09-11 NOTE — Telephone Encounter (Signed)
She and I discussed proceeding with PUS if the OCP taper didn't work.  Also, she can go back to two tablets of the pill daily.  Thanks.

## 2015-09-13 ENCOUNTER — Ambulatory Visit (INDEPENDENT_AMBULATORY_CARE_PROVIDER_SITE_OTHER): Payer: Self-pay | Admitting: Obstetrics & Gynecology

## 2015-09-13 ENCOUNTER — Ambulatory Visit (INDEPENDENT_AMBULATORY_CARE_PROVIDER_SITE_OTHER): Payer: Self-pay

## 2015-09-13 VITALS — BP 120/86 | HR 80 | Resp 16 | Ht 67.75 in | Wt 269.0 lb

## 2015-09-13 DIAGNOSIS — N926 Irregular menstruation, unspecified: Secondary | ICD-10-CM

## 2015-09-13 NOTE — Progress Notes (Signed)
25 y.o. G0 SWF here for pelvic ultrasound due to prolonged bleeding and incomplete correction of this with OCP taper that was initiated on 08/24/14.   Pt's bleeding started in mid October as a typical cycle but then has continued in a heavier then lighter pattern.  Pt thought several times that the bleeding was almost completed but then would start back again.  She was seen on 09/04/15 and started on and OCP taper with Loestring 1.5/30, three tabs for three days.  Pt states bleeding stopped almost immediately and she did not take the three tabs for three days.  She only took it for two days.  Then she took two tablets for two days and then she went down to the one daily.  Then the bleeding started back so she called.  Pt called on the 24th which is 7 days after the OCP taper was initiated but she only reports taking the pills for five days.  Pt is sure she's only taken five days of pills but realizes it was more than five days when she called.  She thinks she took them as advised but may have missed one.  Advised this can increase bleeding again so she needs to be sure she is not missing any at this time.    Pt report the is not heavy.  There are no clots.  She feels well but is just tired of the bleeding.  She has not experienced any issues with headache or nausea.    Pt also wants to talk about weight loss.  She doesn't want medication for this.  She wants the easiest way to lose weight.  Advised pt there is no "easy" way.  It is hard and will take time considering the weight she needs to lose.  She is not exercising regularly.  She is not watching what she is eating.  Of course, these are the first two steps that are necessary for adequate weight loss and maintenance of this.  She is not interested in bariatric program.  D/w pt program at [facility] and importance of nutrition counseling as well as behavior modification.  She will consider.  Patient's last menstrual period was 06/19/2015 (exact date).  Sexually  active:  yes  Contraception: oral contraceptives (estrogen/progesterone)  FINDINGS: UTERUS: 7.1 x 3.7 x 3.2cm EMS: 2.66mm ADNEXA:   Left ovary:  2.5 x 1.9 x 1.5cm            Right ovary:  2.4 x 2.5 x 1.1cm CUL DE SAC:  No free fluid noted  Pt declined physical exam at last visit so recommended today.  EXAM: BP 120/86 mmHg  Pulse 80  Resp 16  Ht 5' 7.75" (1.721 m)  Wt 269 lb (122.018 kg)  BMI 41.20 kg/m2  LMP 06/19/2015 (Exact Date) General appearance:  WNWD Female, NAD Pelvic exam:  VULVA: normal appearing vulva with no masses, tenderness or lesions VAGINA: normal appearing vagina with normal color and discharge, no lesions CERVIX: normal appearing cervix without discharge or lesions, very scant blood noted at os UTERUS: uterus is normal size, shape, consistency and nontender ADNEXA: normal adnexa in size, nontender and no masses.  D/W pt appropriate way to take OCPs and not miss any pills.  She will increase to two tabs a day for two days and then go back to one a day.  She will finish her current pack, then skip the placebo pills and go straight into another pack.  I want her to complete the second  pack and then have a cycle.  I've advised she take the pills for at least three months to regulate her cycle and then she can stop and see what happens with her cycle.  If the irregular bleeding restarts, I would encourage her to stay on OCPs until she has some significant weight loss or until she is ready to try for pregnancy.  May need to use ovulation induction agent at that time.  Assessment:  DUB with prolonged cycle starting in mid-October, much improved with OCP taper Obesity  Plan: Continue OCP taper as above AEX overdue to advised follow-up in 4 months for AEX.  That would be good timing for follow up after this episode.  Pt has rx to last until then.  She does need a Pap in June so could be done a little early at [facility].    ~20 minutes spent with patient >50% of time was in face  to face discussion of above.

## 2015-09-23 ENCOUNTER — Encounter: Payer: Self-pay | Admitting: Obstetrics & Gynecology

## 2015-11-02 ENCOUNTER — Other Ambulatory Visit: Payer: Self-pay | Admitting: Obstetrics & Gynecology

## 2015-11-02 NOTE — Telephone Encounter (Signed)
Junel 1.5/30 mg #1 packs 3 rfs [facility], patient should still have refills until April 2017-rx denied.

## 2015-11-29 ENCOUNTER — Encounter: Payer: Self-pay | Admitting: Obstetrics & Gynecology

## 2015-11-29 MED ORDER — NORETHINDRONE ACET-ETHINYL EST 1.5-30 MG-MCG PO TABS: ORAL_TABLET | ORAL | Status: DC

## 2019-06-08 ENCOUNTER — Telehealth: Payer: Self-pay | Admitting: Obstetrics & Gynecology

## 2019-06-08 NOTE — Telephone Encounter (Signed)
VOID

## 2019-07-06 LAB — OB RESULTS CONSOLE VARICELLA ZOSTER ANTIBODY, IGG: Varicella: IMMUNE

## 2019-07-06 LAB — OB RESULTS CONSOLE RUBELLA ANTIBODY, IGM: Rubella: IMMUNE

## 2019-07-06 LAB — OB RESULTS CONSOLE HEPATITIS B SURFACE ANTIGEN: Hepatitis B Surface Ag: NEGATIVE

## 2019-07-06 LAB — OB RESULTS CONSOLE HIV ANTIBODY (ROUTINE TESTING): HIV: NONREACTIVE

## 2019-08-19 NOTE — L&D Delivery Note (Signed)
Delivery Note Repeatitive decelerations noted when attempted pushing. They did not resolve with laboring down. Counseled pt and advised continue to push to effect delivery. Pt pushed for apprximately with good descent. She was counseled of possible need for kiwi vacuum.   At 10:19 AM a viable female was delivered via Vaginal, Spontaneous (Presentation: Left Occiput Anterior).  APGAR: 8, 9; weight  Pending Compound left arm presentation noted. Nuchal x 1 - loose and left should cord noted and reduced then delivery of anterior and posterior shoulders completed; body easily followed. .   Placenta status: Spontaneous, Intact.  Cord: 3 vessels ; schultz  Cord pH: pending ( venous)  Anesthesia: Epidural Episiotomy: None Lacerations: 1st degree;Periurethral Suture Repair: 2.0 vicryl Est. Blood Loss (mL):   Mom to postpartum.  Baby to Couplet care / Skin to Skin  Desires circumcision in am.  Cathrine Muster 01/26/2020, 10:39 AM

## 2019-11-24 ENCOUNTER — Encounter (HOSPITAL_COMMUNITY): Payer: Self-pay | Admitting: Obstetrics and Gynecology

## 2019-11-24 ENCOUNTER — Inpatient Hospital Stay (HOSPITAL_COMMUNITY)
Admission: AD | Admit: 2019-11-24 | Discharge: 2019-11-24 | Disposition: A | Discharge status: Home / Self Care | Payer: PRIVATE HEALTH INSURANCE | Attending: Obstetrics and Gynecology | Admitting: Obstetrics and Gynecology

## 2019-11-24 ENCOUNTER — Other Ambulatory Visit: Payer: Self-pay

## 2019-11-24 ENCOUNTER — Inpatient Hospital Stay (HOSPITAL_BASED_OUTPATIENT_CLINIC_OR_DEPARTMENT_OTHER): Payer: PRIVATE HEALTH INSURANCE

## 2019-11-24 DIAGNOSIS — M79609 Pain in unspecified limb: Secondary | ICD-10-CM

## 2019-11-24 DIAGNOSIS — O99283 Endocrine, nutritional and metabolic diseases complicating pregnancy, third trimester: Secondary | ICD-10-CM | POA: Diagnosis not present

## 2019-11-24 DIAGNOSIS — I8001 Phlebitis and thrombophlebitis of superficial vessels of right lower extremity: Secondary | ICD-10-CM | POA: Insufficient documentation

## 2019-11-24 DIAGNOSIS — Z881 Allergy status to other antibiotic agents status: Secondary | ICD-10-CM | POA: Insufficient documentation

## 2019-11-24 DIAGNOSIS — Z3A28 28 weeks gestation of pregnancy: Secondary | ICD-10-CM | POA: Diagnosis not present

## 2019-11-24 DIAGNOSIS — O26893 Other specified pregnancy related conditions, third trimester: Secondary | ICD-10-CM | POA: Diagnosis present

## 2019-11-24 DIAGNOSIS — M7989 Other specified soft tissue disorders: Secondary | ICD-10-CM | POA: Diagnosis not present

## 2019-11-24 DIAGNOSIS — E039 Hypothyroidism, unspecified: Secondary | ICD-10-CM | POA: Insufficient documentation

## 2019-11-24 DIAGNOSIS — I809 Phlebitis and thrombophlebitis of unspecified site: Secondary | ICD-10-CM | POA: Diagnosis not present

## 2019-11-24 DIAGNOSIS — O2223 Superficial thrombophlebitis in pregnancy, third trimester: Secondary | ICD-10-CM | POA: Insufficient documentation

## 2019-11-24 HISTORY — DX: Hypothyroidism, unspecified: E03.9

## 2019-11-24 LAB — URINALYSIS, ROUTINE W REFLEX MICROSCOPIC
Bilirubin Urine: NEGATIVE
Glucose, UA: NEGATIVE mg/dL
Hgb urine dipstick: NEGATIVE
Ketones, ur: 5 mg/dL — AB
Nitrite: NEGATIVE
Protein, ur: NEGATIVE mg/dL
Specific Gravity, Urine: 1.012 (ref 1.005–1.030)
pH: 7 (ref 5.0–8.0)

## 2019-11-24 NOTE — Discharge Instructions (Signed)
Phlebitis Phlebitis is soreness and swelling (inflammation) of a vein. Follow these instructions at home: Managing pain, stiffness, and swelling  If told, apply heat to the affected area. Do this as often as told by your doctor. Use the heat source that your doctor tells you to use. This may include a moist heat pack or a heating pad. ? Place a towel between your skin and the heat source. ? Leave the heat on for 20-30 minutes. ? Take off the heat if your skin turns bright red. This is very important if you cannot feel pain, heat, or cold. You may be more likely to get burned.  Raise (elevate) the affected area above the level of your heart while you are sitting or lying down. Medicines  Take over-the-counter and prescription medicines only as told by your doctor.  If you were prescribed an antibiotic medicine, take it as told by your doctor. Do not stop taking the antibiotic even if your condition gets better.  If you take medicines to thin your blood, carry a medical alert card or wear your medical alert jewelry. General instructions   If you have phlebitis in your legs: ? Do not stand or sit for a long time. ? Keep your legs moving. ? Get up and take short walks if you have to sit for a long time. ? Try to avoid bed rest that lasts for a long time. Regular sleep is not bed rest.  Wear compression stockings as told by your doctor. These stockings help: ? To reduce swelling in your legs. ? To prevent blood clots. ? To stop the condition from coming back.  Do not use any products that contain nicotine or tobacco, such as cigarettes and e-cigarettes. If you need help quitting, ask your doctor.  Keep all follow-up visits as told by your doctor. This is important. This may include any follow-up blood tests. Contact a doctor if:  You have strange bruises.  You have bleeding problems.  Your symptoms do not get better.  Your symptoms get worse.  You are taking medicine to treat  swelling (anti-inflammatory medicine) and you get belly (abdominal) pain. Get help right away if:  You have sudden chest pain.  You suddenly have trouble breathing.  You have a fever and your symptoms get worse.  You cough up blood.  You feel dizzy or you pass out.  You have very bad pain and swelling in the affected arm or leg. These symptoms may be an emergency. Do not wait to see if the symptoms will go away. Get medical help right away. Call your local emergency services (911 in the [place] Do not drive yourself to the hospital. Summary  Phlebitis is soreness and swelling (inflammation) of a vein.  Raise (elevate) the affected area above the level of your heart while you are sitting or lying down.  If told, apply heat to the affected area. Do this as often as told by your doctor. Use the heat source that your doctor tells you to use. This may include a moist heat pack or a heating pad.  Take over-the-counter and prescription medicines only as told by your doctor. This information is not intended to replace advice given to you by your health care provider. Make sure you discuss any questions you have with your health care provider. Document Revised: 09/14/2018 Document Reviewed: 09/09/2016 Elsevier Patient Education  2020 Elsevier Inc.  

## 2019-11-24 NOTE — MAU Note (Signed)
Called Vascular dept to make sure they have the order for Doppler studies.  They are aware and will come do study when able.

## 2019-11-24 NOTE — MAU Provider Note (Signed)
Patient Felicia Burns is a 29 y.o. G1P0000 at [redacted]w[redacted]d here with complaints of leg pain that started in the middle of the night. She denies vaginal bleeding, decreased fetal movements, HA, vaginal discharge, vaginal bleeding. She has had an uncomplicated pregnancy thus far.    History     CSN: [id number]  Arrival date and time: 11/24/19 0757   None     Chief Complaint  Patient presents with  . R leg pain   HPI Patient reports that she started having pain in the middle of the night in her right leg, behind her knee cap. THis morning her leg was swollen, and that she had a swollen, puffy vein on her right popliteal region. She also had a calf cramp.  The area also felt hot, and her husband said that her vein area felt hot. Right now she has no pain.   She also reports that she had heart racing last night. She went to bed around 9:30; she felt dizzy and exhausted. She says she was "not doing anything" yesterday. She has felt her heart racing one other time in the past. She had to pull over and it went over. She denies that her heart racing went away within the hour.  OB History    Gravida  1   Para  0   Term  0   Preterm  0   AB  0   Living  0     SAB  0   TAB  0   Ectopic  0   Multiple  0   Live Births              Past Medical History:  Diagnosis Date  . Abnormal uterine bleeding   . Asthma    as a child/situational now  . Dysmenorrhea   . Hypothyroidism     Past Surgical History:  Procedure Laterality Date  . BUNIONECTOMY  2002   left  . skyla iud insertion    . TONSILLECTOMY  1998    Family History  Problem Relation Age of Onset  . Bipolar disorder Mother   . Fibroids Mother        embedded fibroids and ovarian cyst  . Thyroid disease Mother   . Hypertension Mother        ?  Marland Kitchen Bipolar disorder Brother   . Diabetes Maternal Grandmother        prediabetic  . Endometriosis Maternal Grandmother   . Diabetes Paternal Grandfather   .  Prostate cancer Paternal Grandfather   . Heart disease Paternal Grandfather   . Breast cancer Other        maternal great grandmother    Social History   Tobacco Use  . Smoking status: Never Smoker  . Smokeless tobacco: Never Used  Substance Use Topics  . Alcohol use: Yes    Alcohol/week: 1.0 - 3.0 standard drinks    Types: 1 - 3 Standard drinks or equivalent per week  . Drug use: No    Allergies:  Allergies  Allergen Reactions  . Banana   . Erythromycin     Upset stmach    Medications Prior to Admission  Medication Sig Dispense Refill Last Dose  . fluticasone (FLONASE) 50 MCG/ACT nasal spray as needed.     Marland Kitchen ibuprofen (ADVIL,MOTRIN) 200 MG tablet Take 200 mg by mouth every 6 (six) hours as needed for pain.     . Norethindrone Acetate-Ethinyl Estradiol (LOESTRIN 1.5/30, 21,) 1.5-30 MG-MCG tablet Take  as directed 1 Package 2     Review of Systems  Constitutional: Negative.   HENT: Negative.   Respiratory: Negative.   Cardiovascular: Negative.   Gastrointestinal: Negative.   Genitourinary: Negative.   Musculoskeletal: Negative.   Neurological: Negative.    Physical Exam   Blood pressure 131/72, pulse (!) 101, temperature 97.8 F (36.6 C), temperature source Oral, resp. rate 20, weight 127.7 kg, last menstrual period 04/28/2019.  Physical Exam  Constitutional: She appears well-developed.  HENT:  Head: Normocephalic.  Respiratory: Effort normal.  GI: Soft.  Musculoskeletal:        General: No tenderness or edema. Normal range of motion.     Cervical back: Normal range of motion.     Comments: Non-tender, cool, dry to the touch, no swelling or varicosities present.   Neurological: She is alert.  Skin: Skin is warm and dry.    MAU Course  Procedures  MDM -Venous doppler done; negative for DVT -NST: 140 bpm, mod var, present acels, no decels, no contractions.  -Patient has no tachycardia while in MAU; no leg pain while in MAU.  Patient Vitals for the past  24 hrs:  BP Temp Temp src Pulse Resp Weight  11/24/19 1046 124/76 -- -- 97 16 --  11/24/19 0823 131/72 97.8 F (36.6 C) Oral (!) 101 20 --  11/24/19 0819 -- -- -- -- -- 127.7 kg     Assessment and Plan   1. Superficial phlebitis    2. Patient stable for discharge; will keep follow-up OB appointment.   3. Reviewed with patient that she may want to speak to her OB about a cardiology referral if she continues to have increased heart rate; go to [facility] if she develops any SOB, chest pain or other cardiac complaints.   Mervyn Skeeters Jaques Mineer 11/24/2019, 9:21 AM

## 2019-11-24 NOTE — Progress Notes (Signed)
VASCULAR LAB PRELIMINARY  PRELIMINARY  PRELIMINARY  PRELIMINARY  Right lower extremity venous duplex completed.    Preliminary report:  See CV proc for preliminary results.   Stelios Kirby, RVT 11/24/2019, 11:27 AM

## 2019-11-24 NOTE — MAU Note (Signed)
Patient presents to MAU with R mid leg pain/bulging vein that started hurting yesterday along with "heart racing" yesterday.  Rates pain 3/10.  Reports good fetal movement.  Denies vaginal bleeding or LOF.

## 2020-01-10 LAB — OB RESULTS CONSOLE GBS: GBS: NEGATIVE

## 2020-01-25 ENCOUNTER — Inpatient Hospital Stay (HOSPITAL_COMMUNITY)
Admission: AD | Admit: 2020-01-25 | Discharge: 2020-01-28 | DRG: 807 | Disposition: A | Discharge status: Home / Self Care | Payer: Self-pay | Attending: Obstetrics and Gynecology | Admitting: Obstetrics and Gynecology

## 2020-01-25 ENCOUNTER — Encounter (HOSPITAL_COMMUNITY): Payer: Self-pay | Admitting: Obstetrics and Gynecology

## 2020-01-25 ENCOUNTER — Other Ambulatory Visit: Payer: Self-pay

## 2020-01-25 DIAGNOSIS — E669 Obesity, unspecified: Secondary | ICD-10-CM | POA: Diagnosis present

## 2020-01-25 DIAGNOSIS — Z3490 Encounter for supervision of normal pregnancy, unspecified, unspecified trimester: Secondary | ICD-10-CM

## 2020-01-25 DIAGNOSIS — O99214 Obesity complicating childbirth: Secondary | ICD-10-CM | POA: Diagnosis present

## 2020-01-25 DIAGNOSIS — Z3A38 38 weeks gestation of pregnancy: Secondary | ICD-10-CM

## 2020-01-25 DIAGNOSIS — Z20822 Contact with and (suspected) exposure to covid-19: Secondary | ICD-10-CM | POA: Diagnosis present

## 2020-01-25 DIAGNOSIS — O99284 Endocrine, nutritional and metabolic diseases complicating childbirth: Secondary | ICD-10-CM | POA: Diagnosis present

## 2020-01-25 DIAGNOSIS — Z3689 Encounter for other specified antenatal screening: Secondary | ICD-10-CM

## 2020-01-25 DIAGNOSIS — O368130 Decreased fetal movements, third trimester, not applicable or unspecified: Principal | ICD-10-CM | POA: Diagnosis present

## 2020-01-25 DIAGNOSIS — O368190 Decreased fetal movements, unspecified trimester, not applicable or unspecified: Secondary | ICD-10-CM

## 2020-01-25 DIAGNOSIS — E039 Hypothyroidism, unspecified: Secondary | ICD-10-CM | POA: Diagnosis present

## 2020-01-25 HISTORY — DX: Decreased fetal movements, unspecified trimester, not applicable or unspecified: O36.8190

## 2020-01-25 LAB — CBC WITH DIFFERENTIAL/PLATELET
Abs Immature Granulocytes: 0.08 10*3/uL — ABNORMAL HIGH (ref 0.00–0.07)
Basophils Absolute: 0 10*3/uL (ref 0.0–0.1)
Basophils Relative: 0 %
Eosinophils Absolute: 0.1 10*3/uL (ref 0.0–0.5)
Eosinophils Relative: 1 %
HCT: 39 % (ref 36.0–46.0)
Hemoglobin: 13.1 g/dL (ref 12.0–15.0)
Immature Granulocytes: 1 %
Lymphocytes Relative: 15 %
Lymphs Abs: 1.6 10*3/uL (ref 0.7–4.0)
MCH: 32.3 pg (ref 26.0–34.0)
MCHC: 33.6 g/dL (ref 30.0–36.0)
MCV: 96.3 fL (ref 80.0–100.0)
Monocytes Absolute: 0.7 10*3/uL (ref 0.1–1.0)
Monocytes Relative: 7 %
Neutro Abs: 8.4 10*3/uL — ABNORMAL HIGH (ref 1.7–7.7)
Neutrophils Relative %: 76 %
Platelets: 195 10*3/uL (ref 150–400)
RBC: 4.05 MIL/uL (ref 3.87–5.11)
RDW: 13.1 % (ref 11.5–15.5)
WBC: 10.9 10*3/uL — ABNORMAL HIGH (ref 4.0–10.5)
nRBC: 0 % (ref 0.0–0.2)

## 2020-01-25 LAB — COMPREHENSIVE METABOLIC PANEL
ALT: 16 U/L (ref 0–44)
AST: 21 U/L (ref 15–41)
Albumin: 3.2 g/dL — ABNORMAL LOW (ref 3.5–5.0)
Alkaline Phosphatase: 128 U/L — ABNORMAL HIGH (ref 38–126)
Anion gap: 15 (ref 5–15)
BUN: 9 mg/dL (ref 6–20)
CO2: 18 mmol/L — ABNORMAL LOW (ref 22–32)
Calcium: 9.2 mg/dL (ref 8.9–10.3)
Chloride: 104 mmol/L (ref 98–111)
Creatinine, Ser: 0.65 mg/dL (ref 0.44–1.00)
GFR calc Af Amer: >60 mL/min (ref >60)
GFR calc non Af Amer: >60 mL/min (ref >60)
Glucose, Bld: 80 mg/dL (ref 70–99)
Potassium: 4.3 mmol/L (ref 3.5–5.1)
Sodium: 137 mmol/L (ref 135–145)
Total Bilirubin: 0.6 mg/dL (ref 0.3–1.2)
Total Protein: 6.4 g/dL — ABNORMAL LOW (ref 6.5–8.1)

## 2020-01-25 LAB — ABO/RH: ABO/RH(D): B POS

## 2020-01-25 LAB — PROTEIN / CREATININE RATIO, URINE
Creatinine, Urine: 110.42 mg/dL
Protein Creatinine Ratio: 0.11 mg/mg{Cre} (ref 0.00–0.15)
Total Protein, Urine: 12 mg/dL

## 2020-01-25 LAB — TYPE AND SCREEN
ABO/RH(D): B POS
Antibody Screen: NEGATIVE

## 2020-01-25 LAB — SARS CORONAVIRUS 2 BY RT PCR (HOSPITAL ORDER, PERFORMED IN ~~LOC~~ HOSPITAL LAB): SARS Coronavirus 2: NEGATIVE

## 2020-01-25 MED ORDER — MISOPROSTOL 50MCG HALF TABLET
50.0000 ug | ORAL_TABLET | Freq: Once | ORAL | Status: DC
Start: 2020-01-25 — End: 2020-01-25

## 2020-01-25 MED ORDER — TERBUTALINE SULFATE 1 MG/ML IJ SOLN: 0.2500 mg | Freq: Once | INTRAMUSCULAR | Status: DC | PRN

## 2020-01-25 MED ORDER — OXYCODONE-ACETAMINOPHEN 5-325 MG PO TABS: 2.0000 | ORAL_TABLET | ORAL | Status: DC | PRN

## 2020-01-25 MED ORDER — MISOPROSTOL 50MCG HALF TABLET
50.0000 ug | ORAL_TABLET | Freq: Once | ORAL | Status: AC
  Administered 2020-01-25: 50 ug via ORAL
  Filled 2020-01-25: qty 1

## 2020-01-25 MED ORDER — OXYTOCIN BOLUS FROM INFUSION
500.0000 mL | Freq: Once | INTRAVENOUS | Status: AC
  Administered 2020-01-26: 500 mL via INTRAVENOUS

## 2020-01-25 MED ORDER — OXYTOCIN-SODIUM CHLORIDE 30-0.9 UT/500ML-% IV SOLN
2.5000 [IU]/h | INTRAVENOUS | Status: DC
  Administered 2020-01-26: 2.5 [IU]/h via INTRAVENOUS
  Filled 2020-01-25: qty 500

## 2020-01-25 MED ORDER — ACETAMINOPHEN 325 MG PO TABS: 650.0000 mg | ORAL_TABLET | ORAL | Status: DC | PRN

## 2020-01-25 MED ORDER — LACTATED RINGERS IV SOLN: INTRAVENOUS | Status: DC

## 2020-01-25 MED ORDER — MISOPROSTOL 25 MCG QUARTER TABLET
25.0000 ug | ORAL_TABLET | Freq: Three times a day (TID) | ORAL | Status: DC | PRN
  Administered 2020-01-25: 25 ug via VAGINAL

## 2020-01-25 MED ORDER — LACTATED RINGERS IV SOLN: 500.0000 mL | INTRAVENOUS | Status: DC | PRN

## 2020-01-25 MED ORDER — OXYCODONE-ACETAMINOPHEN 5-325 MG PO TABS: 1.0000 | ORAL_TABLET | ORAL | Status: DC | PRN

## 2020-01-25 MED ORDER — MISOPROSTOL 25 MCG QUARTER TABLET
ORAL_TABLET | ORAL | Status: AC
  Filled 2020-01-25: qty 1

## 2020-01-25 MED ORDER — LIDOCAINE HCL (PF) 1 % IJ SOLN
30.0000 mL | INTRAMUSCULAR | Status: AC | PRN
  Administered 2020-01-26: 30 mL via SUBCUTANEOUS

## 2020-01-25 MED ORDER — ONDANSETRON HCL 4 MG/2ML IJ SOLN: 4.0000 mg | Freq: Four times a day (QID) | INTRAMUSCULAR | Status: DC | PRN

## 2020-01-25 MED ORDER — BUTORPHANOL TARTRATE 1 MG/ML IJ SOLN
1.0000 mg | INTRAMUSCULAR | Status: DC | PRN
  Administered 2020-01-26: 1 mg via INTRAVENOUS
  Filled 2020-01-25: qty 1

## 2020-01-25 MED ORDER — SOD CITRATE-CITRIC ACID 500-334 MG/5ML PO SOLN: 30.0000 mL | ORAL | Status: DC | PRN

## 2020-01-25 NOTE — H&P (Signed)
Mamye Capria Cartaya is a 29 y.o. female G1P0 at 79+ with decreased fetal movement.  Pregnancy dated by LMP cw early [place].  Pt declined Tdap, RNI.  Pregnancy complicated by maternal obesity and hypothyroidism.  D/W pt IOL given decreased Fetal Movement.    OB History    Gravida  1   Para  0   Term  0   Preterm  0   AB  0   Living  0     SAB  0   TAB  0   Ectopic  0   Multiple  0   Live Births            G1 present  No abn pap No STDs  Past Medical History:  Diagnosis Date  . Abnormal uterine bleeding   . Asthma    as a child/situational now  . Decreased fetal movement 01/25/2020  . Dysmenorrhea   . Hypothyroidism    Past Surgical History:  Procedure Laterality Date  . BUNIONECTOMY  2002   left  . skyla iud insertion    . TONSILLECTOMY  1998   Family History: family history includes Bipolar disorder in her brother and mother; Breast cancer in an other family member; Diabetes in her maternal grandmother and paternal grandfather; Endometriosis in her maternal grandmother; Fibroids in her mother; Heart disease in her paternal grandfather; Hypertension in her mother; Prostate cancer in her paternal grandfather; Thyroid disease in her mother. Social History:  reports that she has never smoked. She has never used smokeless tobacco. She reports previous alcohol use of about 1.0 - 3.0 standard drinks of alcohol per week. She reports that she does not use drugs.   Meds: PNV, levothyroxine All Banana, Erythromycin    Maternal Diabetes: No Genetic Screening: Declined Maternal Ultrasounds/Referrals: Isolated choroid plexus cyst Fetal Ultrasounds or other Referrals:  None Maternal Substance Abuse:  No Significant Maternal Medications:  None Significant Maternal Lab Results:  Group B Strep negative Other Comments:  None  Review of Systems  Constitutional: Negative.   HENT: Negative.   Respiratory: Negative.   Cardiovascular: Negative.   Gastrointestinal: Negative.    Genitourinary: Negative.   Musculoskeletal: Negative.   Skin: Negative.   Neurological: Negative.   Psychiatric/Behavioral: Negative.    Maternal Medical History:  Reason for admission: Contractions.  Decreased fetal movement  Contractions: Frequency: irregular.   Perceived severity is moderate.    Fetal activity: Perceived fetal activity is normal.    Prenatal complications: Maternal obesity, hypothyroidism; RNI; isolated CP cyst  Prenatal Complications - Diabetes: none.    Dilation: 1(INT os closed) Effacement (%): 50 Station: -2 Exam by:: S Earl RNC Blood pressure 127/79, pulse (!) 101, temperature 98.9 F (37.2 C), temperature source Oral, resp. rate 16, height 5\' 9"  (1.753 m), weight 132.9 kg, last menstrual period 04/28/2019, SpO2 97 %. Maternal Exam:  Abdomen: Patient reports no abdominal tenderness. Fundal height is appropriate for gestation.   Estimated fetal weight is 8#.   Fetal presentation: vertex  Introitus: Normal vulva. Normal vagina.    Physical Exam  Constitutional: She is oriented to person, place, and time. She appears well-developed and well-nourished.  HENT:  Head: Normocephalic and atraumatic.  Cardiovascular: Normal rate and regular rhythm.  Respiratory: Effort normal and breath sounds normal. No respiratory distress. She has no wheezes.  GI: Soft. Bowel sounds are normal. She exhibits no distension. There is no abdominal tenderness.  Genitourinary:    Vulva normal.   Musculoskeletal:  General: Normal range of motion.  Neurological: She is alert and oriented to person, place, and time.  Skin: Skin is warm and dry.  Psychiatric: She has a normal mood and affect. Her behavior is normal.    Prenatal labs: ABO, Rh:  B+ Antibody:  nrg Rubella:  NI RPR:   NR HBsAg:  neg  HIV:   neg GBS:   neg  Hgb 13.9/Plt 249/Ur Cx neg/Chl neg/GC neg/VI/Hgb electro WNL/Hep C neg/glucola 113 TSH WNL  Nl anat, CP cyst, ant  plac  Assessment/Plan: 29yo G1P0 at 38wk w decreased FM, for IOL Epidural prn cytotec po x 1, also AROM and pitocin to augment Expect SVD   Phinehas Grounds Bovard-Stuckert 01/25/2020, 5:31 PM

## 2020-01-25 NOTE — MAU Note (Signed)
Presents with c/o decreased FM.  Reports has felt little movement and "he's usually wild".  Reports no VB but spotting, had VE yesterday.  Denies LOF.

## 2020-01-25 NOTE — MAU Provider Note (Signed)
Ms. Felicia Burns is a 29 y.o. G1P0000 female at [redacted]w[redacted]d weeks gestation presenting to MAU with complaints of DFM. MAU provider was requested by RN to verify presentation.  REACTIVE NST - FHR: 125 bpm / moderate variability / accels present / decels absent / TOCO: irregular UC's  Patient informed that the ultrasound is considered a limited OB ultrasound and is not intended to be a complete ultrasound exam.  Patient also informed that the ultrasound is not being completed with the intent of assessing for fetal or placental anomalies or any pelvic abnormalities.  Explained that the purpose of today's ultrasound is to assess for presentation.  Baby was found to be in a cephalic presentation. Patient acknowledges the purpose of the exam and the limitations of the study.  Raelyn Mora, CNM  01/25/2020 2:44 PM

## 2020-01-26 ENCOUNTER — Inpatient Hospital Stay (HOSPITAL_COMMUNITY): Payer: Self-pay | Admitting: Anesthesiology

## 2020-01-26 ENCOUNTER — Encounter (HOSPITAL_COMMUNITY): Payer: Self-pay | Admitting: Obstetrics and Gynecology

## 2020-01-26 LAB — RPR: RPR Ser Ql: NONREACTIVE

## 2020-01-26 MED ORDER — ONDANSETRON HCL 4 MG/2ML IJ SOLN: 4.0000 mg | INTRAMUSCULAR | Status: DC | PRN

## 2020-01-26 MED ORDER — EPHEDRINE 5 MG/ML INJ: 10.0000 mg | INTRAVENOUS | Status: DC | PRN

## 2020-01-26 MED ORDER — PRENATAL MULTIVITAMIN CH
1.0000 | ORAL_TABLET | Freq: Every day | ORAL | Status: DC
  Administered 2020-01-27 – 2020-01-28 (×2): 1 via ORAL
  Filled 2020-01-26 (×2): qty 1

## 2020-01-26 MED ORDER — ZOLPIDEM TARTRATE 5 MG PO TABS: 5.0000 mg | ORAL_TABLET | Freq: Every evening | ORAL | Status: DC | PRN

## 2020-01-26 MED ORDER — LEVOTHYROXINE SODIUM 88 MCG PO TABS
88.0000 ug | ORAL_TABLET | Freq: Every day | ORAL | Status: DC
  Filled 2020-01-26: qty 1

## 2020-01-26 MED ORDER — FENTANYL-BUPIVACAINE-NACL 0.5-0.125-0.9 MG/250ML-% EP SOLN: 12.0000 mL/h | EPIDURAL | Status: DC | PRN

## 2020-01-26 MED ORDER — DIBUCAINE (PERIANAL) 1 % EX OINT: 1.0000 "application " | TOPICAL_OINTMENT | CUTANEOUS | Status: DC | PRN

## 2020-01-26 MED ORDER — DIPHENHYDRAMINE HCL 50 MG/ML IJ SOLN: 12.5000 mg | INTRAMUSCULAR | Status: DC | PRN

## 2020-01-26 MED ORDER — WITCH HAZEL-GLYCERIN EX PADS: 1.0000 "application " | MEDICATED_PAD | CUTANEOUS | Status: DC | PRN

## 2020-01-26 MED ORDER — PHENYLEPHRINE 40 MCG/ML (10ML) SYRINGE FOR IV PUSH (FOR BLOOD PRESSURE SUPPORT)
80.0000 ug | PREFILLED_SYRINGE | INTRAVENOUS | Status: DC | PRN
  Administered 2020-01-26: 80 ug via INTRAVENOUS

## 2020-01-26 MED ORDER — SODIUM CHLORIDE (PF) 0.9 % IJ SOLN
INTRAMUSCULAR | Status: DC | PRN
  Administered 2020-01-26: 12 mL/h via EPIDURAL

## 2020-01-26 MED ORDER — LACTATED RINGERS IV SOLN: 500.0000 mL | Freq: Once | INTRAVENOUS | Status: DC

## 2020-01-26 MED ORDER — TERBUTALINE SULFATE 1 MG/ML IJ SOLN: 0.2500 mg | Freq: Once | INTRAMUSCULAR | Status: DC | PRN

## 2020-01-26 MED ORDER — FENTANYL-BUPIVACAINE-NACL 0.5-0.125-0.9 MG/250ML-% EP SOLN
EPIDURAL | Status: AC
  Filled 2020-01-26: qty 250

## 2020-01-26 MED ORDER — SENNOSIDES-DOCUSATE SODIUM 8.6-50 MG PO TABS
2.0000 | ORAL_TABLET | ORAL | Status: DC
  Administered 2020-01-26 – 2020-01-27 (×2): 2 via ORAL
  Filled 2020-01-26 (×2): qty 2

## 2020-01-26 MED ORDER — OXYCODONE HCL 5 MG PO TABS: 5.0000 mg | ORAL_TABLET | ORAL | Status: DC | PRN

## 2020-01-26 MED ORDER — ACETAMINOPHEN 325 MG PO TABS
650.0000 mg | ORAL_TABLET | ORAL | Status: DC | PRN
  Administered 2020-01-26: 650 mg via ORAL
  Filled 2020-01-26: qty 2

## 2020-01-26 MED ORDER — LIDOCAINE HCL (PF) 1 % IJ SOLN
INTRAMUSCULAR | Status: AC
  Filled 2020-01-26: qty 30

## 2020-01-26 MED ORDER — BENZOCAINE-MENTHOL 20-0.5 % EX AERO
1.0000 "application " | INHALATION_SPRAY | CUTANEOUS | Status: DC | PRN
  Administered 2020-01-26: 1 via TOPICAL
  Filled 2020-01-26: qty 56

## 2020-01-26 MED ORDER — DIPHENHYDRAMINE HCL 25 MG PO CAPS: 25.0000 mg | ORAL_CAPSULE | Freq: Four times a day (QID) | ORAL | Status: DC | PRN

## 2020-01-26 MED ORDER — ONDANSETRON HCL 4 MG PO TABS: 4.0000 mg | ORAL_TABLET | ORAL | Status: DC | PRN

## 2020-01-26 MED ORDER — LIDOCAINE HCL (PF) 1 % IJ SOLN
INTRAMUSCULAR | Status: DC | PRN
  Administered 2020-01-26: 8 mL via EPIDURAL

## 2020-01-26 MED ORDER — OXYCODONE HCL 5 MG PO TABS: 10.0000 mg | ORAL_TABLET | ORAL | Status: DC | PRN

## 2020-01-26 MED ORDER — TETANUS-DIPHTH-ACELL PERTUSSIS 5-2.5-18.5 LF-MCG/0.5 IM SUSP
0.5000 mL | Freq: Once | INTRAMUSCULAR | Status: AC
  Administered 2020-01-28: 0.5 mL via INTRAMUSCULAR
  Filled 2020-01-26: qty 0.5

## 2020-01-26 MED ORDER — OXYTOCIN-SODIUM CHLORIDE 30-0.9 UT/500ML-% IV SOLN
1.0000 m[IU]/min | INTRAVENOUS | Status: DC
  Administered 2020-01-26: 2 m[IU]/min via INTRAVENOUS
  Filled 2020-01-26: qty 500

## 2020-01-26 MED ORDER — IBUPROFEN 600 MG PO TABS
600.0000 mg | ORAL_TABLET | Freq: Four times a day (QID) | ORAL | Status: DC
  Administered 2020-01-26 – 2020-01-28 (×9): 600 mg via ORAL
  Filled 2020-01-26 (×8): qty 1

## 2020-01-26 MED ORDER — PHENYLEPHRINE 40 MCG/ML (10ML) SYRINGE FOR IV PUSH (FOR BLOOD PRESSURE SUPPORT)
PREFILLED_SYRINGE | INTRAVENOUS | Status: AC
  Filled 2020-01-26: qty 10

## 2020-01-26 MED ORDER — PHENYLEPHRINE 40 MCG/ML (10ML) SYRINGE FOR IV PUSH (FOR BLOOD PRESSURE SUPPORT): 80.0000 ug | PREFILLED_SYRINGE | INTRAVENOUS | Status: DC | PRN

## 2020-01-26 MED ORDER — COCONUT OIL OIL
1.0000 "application " | TOPICAL_OIL | Status: DC | PRN
  Administered 2020-01-26: 1 via TOPICAL

## 2020-01-26 MED ORDER — SIMETHICONE 80 MG PO CHEW: 80.0000 mg | CHEWABLE_TABLET | ORAL | Status: DC | PRN

## 2020-01-26 NOTE — Progress Notes (Signed)
Patient ID: Felicia Burns, female   DOB: 09-24-1990, 29 y.o.   MRN: [id number]  Some cramping with ctx; tolerating well.   AFVSS gen NAD FHTs 130's, mod var, + accels, category 1 toco irr ctx, after AROM q 2-31min  AROM for clear fluid, without complication/difficulty  SVE 2/70/-2  Continue IOL with pitocin Expect SVD

## 2020-01-26 NOTE — Progress Notes (Signed)
Patient ID: Felicia Burns, female   DOB: 1991/05/26, 29 y.o.   MRN: [id number] Pt comfortable with epidural. Reports increased pelvic pressure with last contraction; not persistent. +Fms VSS GEN - NAD EFM - 145, cat 2, irregular variables TOCO - ctxs hard to monitor  A/P: Prime progressing in labor on pitocin         9.5cm dilated         Continue with expectant mgmt; anticipate imminent delivery

## 2020-01-26 NOTE — Anesthesia Postprocedure Evaluation (Signed)
Anesthesia Post Note  Patient: Felicia Burns  Procedure(s) Performed: AN AD HOC LABOR EPIDURAL     Patient location during evaluation: Mother Baby Anesthesia Type: Epidural Level of consciousness: awake and alert Pain management: pain level controlled Vital Signs Assessment: post-procedure vital signs reviewed and stable Respiratory status: spontaneous breathing, nonlabored ventilation and respiratory function stable Cardiovascular status: stable Postop Assessment: no headache, no backache and epidural receding Anesthetic complications: no   No complications documented.  Last Vitals:  Vitals:   01/26/20 1230 01/26/20 1330  BP: 110/76 116/76  Pulse: 87 84  Resp: 18 18  Temp:  36.5 C  SpO2: 100% 97%    Last Pain:  Vitals:   01/26/20 1445  TempSrc:   PainSc: 5    Pain Goal:                   Felicia Burns

## 2020-01-26 NOTE — Anesthesia Preprocedure Evaluation (Addendum)
Anesthesia Evaluation  Patient identified by MRN, date of birth, ID band Patient awake    Reviewed: Allergy & Precautions, NPO status , Patient's Chart, lab work & pertinent test results  Airway Mallampati: III  TM Distance: >3 FB Neck ROM: Limited   Comment: ANTERIOR ! Dental no notable dental hx. (+) Teeth Intact, Dental Advisory Given ANTERIOR :   Pulmonary neg pulmonary ROS, asthma ,  Childhood asthma   Pulmonary exam normal breath sounds clear to auscultation       Cardiovascular negative cardio ROS Normal cardiovascular exam Rhythm:Regular Rate:Normal     Neuro/Psych negative neurological ROS  negative psych ROS   GI/Hepatic negative GI ROS, Neg liver ROS,   Endo/Other  negative endocrine ROSHypothyroidism Morbid obesityBMI 43  Renal/GU negative Renal ROS  negative genitourinary   Musculoskeletal negative musculoskeletal ROS (+)   Abdominal (+) + obese,   Peds negative pediatric ROS (+)  Hematology negative hematology ROS (+) hct 39, plt 195   Anesthesia Other Findings   Reproductive/Obstetrics negative OB ROS (+) Pregnancy                            Anesthesia Physical Anesthesia Plan  ASA: III and emergent  Anesthesia Plan: Epidural   Post-op Pain Management:    Induction:   PONV Risk Score and Plan: 2  Airway Management Planned: Natural Airway  Additional Equipment: None  Intra-op Plan:   Post-operative Plan:   Informed Consent: I have reviewed the patients History and Physical, chart, labs and discussed the procedure including the risks, benefits and alternatives for the proposed anesthesia with the patient or authorized representative who has indicated his/her understanding and acceptance.       Plan Discussed with: Anesthesiologist  Anesthesia Plan Comments:        Anesthesia Quick Evaluation

## 2020-01-26 NOTE — Lactation Note (Signed)
This note was copied from a baby's chart. Lactation Consultation Note  Patient Name: Felicia Burns [id number]'G Date: 01/26/2020 Reason for consult: Initial assessment;Difficult latch;Early term 77-38.6wks   RN reports to [facility] that infant has difficulty latching and is unable to sustain latch.  RN has provided mom 16NS for latch attempt without success.  DEBP was set up by RN and mom reports nipple soreness after pumping.  LC entered room and infant was asleep lying next to mom in bed.  LC suggested placed infant STS.  Hand expression reviewed.  No drops seen.  When attempting to assess infant suck, LC noted recessed chin and visible lingual frenulum.  Infant would not open to suck and was placed on chest.  When mom attempted to latch in laid back position, infant began gagging.  Very small amount of saliva suction from cheek.  Infant asleep not showing cues.  LC suggested dad hold infant and reviewed hand expression, pumping with initiate setting, and flange fit with mom.  Mom was changed to 21 size flange and appropriate flange fit reviewed with family.   Mom was provided a size 16NS.  LC encouraged mom to call out when infant shows cues in order to have RN or LC observe and assist with feeding before using shield.  Mom has compressible breast tissue with small, flat nipple tissue.    Shells were provided and usage explained along with cleaning parts.  Mom was encouraged to hand express and use hand pump prior to latching.  Mom recently pumped using DEBP.  LC suggested using DEBP if infant doesn't feed well for next assisted feeding.    BF basics reviewed with family.  Lactation brochure and phone number shared as well as [facility] information and OP LC services.   Maternal Data Does the patient have breastfeeding experience prior to this delivery?: Yes  Feeding Feeding Type: Breast Fed  LATCH Score Latch: Too sleepy or reluctant, no latch achieved, no sucking elicited.  Audible Swallowing:  None  Type of Nipple: Flat  Comfort (Breast/Nipple): Filling, red/small blisters or bruises, mild/mod discomfort  Hold (Positioning): Assistance needed to correctly position infant at breast and maintain latch.  LATCH Score: 3  Interventions Interventions: Breast feeding basics reviewed;Skin to skin;Shells  Lactation Tools Discussed/Used Tools: Shells;Flanges Flange Size: 21 Shell Type: Inverted Pump Review: Setup, frequency, and cleaning   Consult Status Consult Status: Follow-up Date: 01/27/20 Follow-up type: In-patient    Maryruth Hancock [facility] 01/26/2020, 7:18 PM

## 2020-01-26 NOTE — Anesthesia Procedure Notes (Signed)
Epidural Patient location during procedure: OB Start time: 01/26/2020 7:04 AM End time: 01/26/2020 7:10 AM  Staffing Anesthesiologist: Bethena Midget, MD  Preanesthetic Checklist Completed: patient identified, IV checked, site marked, risks and benefits discussed, surgical consent, monitors and equipment checked, pre-op evaluation and timeout performed  Epidural Patient position: sitting Prep: DuraPrep and site prepped and draped Patient monitoring: continuous pulse ox and blood pressure Approach: midline Location: L2-L3 Injection technique: LOR air  Needle:  Needle type: Tuohy  Needle gauge: 17 G Needle length: 9 cm and 9 Needle insertion depth: 9 cm Catheter type: closed end flexible Catheter size: 19 Gauge Catheter at skin depth: 15 cm Test dose: negative  Assessment Events: blood not aspirated, injection not painful, no injection resistance, no paresthesia and negative IV test

## 2020-01-27 LAB — CBC
HCT: 33.3 % — ABNORMAL LOW (ref 36.0–46.0)
Hemoglobin: 10.9 g/dL — ABNORMAL LOW (ref 12.0–15.0)
MCH: 32 pg (ref 26.0–34.0)
MCHC: 32.7 g/dL (ref 30.0–36.0)
MCV: 97.7 fL (ref 80.0–100.0)
Platelets: 166 10*3/uL (ref 150–400)
RBC: 3.41 MIL/uL — ABNORMAL LOW (ref 3.87–5.11)
RDW: 13.2 % (ref 11.5–15.5)
WBC: 9 10*3/uL (ref 4.0–10.5)
nRBC: 0 % (ref 0.0–0.2)

## 2020-01-27 NOTE — Addendum Note (Signed)
Addendum  created 01/27/20 0742 by Algis Greenhouse, CRNA   Charge Capture section accepted, Clinical Note Signed, Visit diagnoses modified

## 2020-01-27 NOTE — Anesthesia Postprocedure Evaluation (Signed)
Anesthesia Post Note  Patient: Felicia Burns  Procedure(s) Performed: AN AD HOC LABOR EPIDURAL     Patient location during evaluation: Mother Baby Anesthesia Type: Epidural Level of consciousness: awake Pain management: satisfactory to patient Vital Signs Assessment: post-procedure vital signs reviewed and stable Respiratory status: spontaneous breathing Cardiovascular status: stable Anesthetic complications: no   No complications documented.  Last Vitals:  Vitals:   01/27/20 0250 01/27/20 0506  BP: (!) 105/59 118/81  Pulse: 82 87  Resp: 17 17  Temp: 36.7 C 36.5 C  SpO2: 99% 99%    Last Pain:  Vitals:   01/27/20 0506  TempSrc: Oral  PainSc:    Pain Goal:                   Cephus Shelling

## 2020-01-27 NOTE — Progress Notes (Signed)
POSTPARTUM PROGRESS NOTE  Post Partum Day #1  Subjective:  No acute events overnight.  Pt denies problems with ambulating, voiding or po intake.  She denies nausea or vomiting.  Pain is well controlled.   Lochia Minimal. States peds MD saw baby boy this morning and noted some issues with scrotum and penile skin, would consider peds urology for circumcision. Patient is quite amenable to this  Objective: Blood pressure 118/81, pulse 87, temperature 97.7 F (36.5 C), temperature source Oral, resp. rate 17, height 5\' 9"  (1.753 m), weight 132.9 kg, last menstrual period 04/28/2019, SpO2 99 %, unknown if currently breastfeeding.  Physical Exam:  General: alert, cooperative and no distress Lochia:normal flow Chest: CTAB Heart: RRR no m/r/g Abdomen: +BS, soft, nontender Uterine Fundus: firm, 2cm below umbilicus GU: suture intact, healing well, no purulent drainage Extremities: neg edema, neg calf TTP BL, neg Homans BL  Recent Labs    01/25/20 1500 01/27/20 0511  HGB 13.1 10.9*  HCT 39.0 33.3*    Assessment/Plan:  ASSESSMENT: Felicia Burns is a 29 y.o. G1P1001 s/p SVD @ [redacted]w[redacted]d. PNC c/b hypothyroidism, obesity.   Plan for discharge tomorrow and Lactation consult  Will contact peds urology for outpatient circumcision   LOS: 2 days

## 2020-01-28 MED ORDER — FERROUS SULFATE 325 (65 FE) MG PO TABS: 325.0000 mg | ORAL_TABLET | Freq: Every day | ORAL | 1 refills | Status: DC

## 2020-01-28 MED ORDER — IBUPROFEN 800 MG PO TABS: 800.0000 mg | ORAL_TABLET | Freq: Three times a day (TID) | ORAL | 1 refills | Status: AC | PRN

## 2020-01-28 MED ORDER — DOCUSATE SODIUM 100 MG PO CAPS
100.0000 mg | ORAL_CAPSULE | Freq: Two times a day (BID) | ORAL | 0 refills | Status: DC
Start: 2020-01-28 — End: 2020-12-11

## 2020-01-28 NOTE — Progress Notes (Signed)
POSTPARTUM PROGRESS NOTE  Post Partum Day #2  Subjective:  No acute events overnight.  Pt denies problems with ambulating, voiding or po intake.  She denies nausea or vomiting.  Pain is well controlled.   Lochia Minimal. Ready for discharge  Objective: Blood pressure 109/83, pulse 80, temperature 97.9 F (36.6 C), temperature source Oral, resp. rate 18, height 5\' 9"  (1.753 m), weight 132.9 kg, last menstrual period 04/28/2019, SpO2 99 %, unknown if currently breastfeeding.  Physical Exam:  General: alert, cooperative and no distress Lochia:normal flow Chest: CTAB Heart: RRR no m/r/g Abdomen: +BS, soft, nontender Uterine Fundus: firm, 2cm below umbilicus GU: suture intact, healing well, no purulent drainage Extremities: neg edema, neg calf TTP BL, neg Homans BL  Recent Labs    01/25/20 1500 01/27/20 0511  HGB 13.1 10.9*  HCT 39.0 33.3*    Assessment/Plan:  ASSESSMENT: Felicia Burns is a 29 y.o. G1P1001 s/p SVD @ [redacted]w[redacted]d. PNC c/b hypothyroidism, obesity.   Discharge home  Will contact peds urology for outpatient circumcision, has appt on Monday Unsure regarding contraception   LOS: 3 days

## 2020-01-28 NOTE — Progress Notes (Signed)
CSW received consult due to score 10 on Edinburgh Depression Screen. CSW visited [name] at bedside to offer support and complete assessment. CSW introduced self and stated purpose for visit. FOB and infant, Claxton were present at time of visit, however, FOB stepped out of room after education to offer [name] privacy during assessment. [name] and FOB were pleasant and engaged during visit.   CSW provided education regarding Baby Blues vs PMADs and provided [name] and FOB with resources for mental health follow up.  CSW encouraged [name] and FOB to evaluate mental health throughout the postpartum period with the use of the New Mom Checklist developed by [employer]partum [employer] as well as the Edinburgh Postnatal Depression Scale and notify a medical professional if symptoms arise. Both FOB and [name] agreed and denied any questions.     CSW provided review of Sudden Infant Death Syndrome (SIDS) precautions. Both [name] and FOB denied any questions. [name] confirmed having all needed items for baby including car seat and crib and bassinet for baby's safe sleep.   During assessment, [name] explained EDS score is related to concerns associated with baby coming early and interrupted business plans. "Don't get me wrong, I am happy about him being here, I am just worried about my clients and the service I prefer to provide them. I care about my clients and the service I provide" [name] owns a cinema production business. CSW normalized [name]'s concerns and offered support and encouragement. [name] was very receptive. CSW assessed for MH hx. [name] denied any dx, however, reported a traumatic experience with church family three years ago which cause her to end the relationship. [name] reported six months after the church event she felt sadness and had 2 weeks of SI. [name] reported she did not tell anyone but her husband, and after telling him she felt better. [name] added  several other positive events such as trip to [place] and focusing on business helped push her  through "the dark period". CSW encouraged [name] to pursue therapy, as [name] was tearful while telling story. [name] agreed it may be time to consider counseling. CSW offered resources. [name] denied any SI, HI, or domestic violence. [name] identified FOB, mom, and friend as support. [name] identified current mood as content, and stated she is happy about infant.    CSW identifies no further need for intervention and no barriers to discharge at this time.  Felicia Burns, MSW, LCSWA Clinical Social Worker [phone] 

## 2020-01-28 NOTE — Lactation Note (Signed)
This note was copied from a baby's chart. Lactation Consultation Note Baby 4 hrs old. Mom using formula d/t mom isn't putting baby to the breast. Mom is pumping and formula feeding baby until her milk comes in the she will be pumping and bottle feeding BM.  Mom states she has flat and one nipple is slightly inverted and she doesn't have good nipples for BF so she wants to pump because she wants to give the baby BM. Praised mom.  Discussed hands free bra, breast massage, milk storage, pumping, supply and demand, engorgement, things to avoid, clogged ducts, and power pumping.  Reminded of LC OP services.  Suggested mom use #21 flange. Mom has small nipples. Encouraged breast massage while pumping. Mom states she feels good about pumping at this time.   Patient Name: Felicia Burns [id number] Date: 01/28/2020 Reason for consult: Follow-up assessment;Primapara;Early term 37-38.6wks   Maternal Data    Feeding Feeding Type: Donor Breast Milk Nipple Type: Slow - flow  LATCH Score       Type of Nipple: Flat (momn stated Rt. nipple was inverted. appears flat at this time)  Comfort (Breast/Nipple): Filling, red/small blisters or bruises, mild/mod discomfort (breast tender)        Interventions Interventions: DEBP;Breast compression;Coconut oil  Lactation Tools Discussed/Used Tools: Pump;Coconut oil Flange Size: 21 Shell Type: Inverted Breast pump type: Double-Electric Breast Pump   Consult Status Consult Status: Follow-up Date: 01/28/20 Follow-up type: In-patient    [name] 01/28/2020, 3:54 AM

## 2020-01-28 NOTE — Discharge Summary (Signed)
Postpartum Discharge Summary  Date of Service updated     Patient Name: Felicia Burns DOB: 09-03-1990 MRN: [id number]  Date of admission: 01/25/2020 Delivery date:01/26/2020  Delivering provider: Sherlyn Hay  Date of discharge: 01/28/2020  Admitting diagnosis: Pregnant [Z34.90] Intrauterine pregnancy: 100w1d    Secondary diagnosis:  Principal Problem:   Decreased fetal movement Active Problems:   Pregnant   SVD (spontaneous vaginal delivery)  Additional problems: Hypothyroidism    Discharge diagnosis: Term Pregnancy Delivered                                              Post partum procedures:none Augmentation: AROM, Pitocin and Cytotec Complications: None  Hospital course: Induction of Labor With Vaginal Delivery   29y.o. yo G1P1001 at 388w1das admitted to the hospital 01/25/2020 for induction of labor.  Indication for induction: decreased fetal movement at term.  Patient had an uncomplicated labor course as follows: Membrane Rupture Time/Date: 4:18 AM ,01/26/2020   Delivery Method:Vaginal, Spontaneous  Episiotomy: None  Lacerations:  1st degree;Periurethral  Details of delivery can be found in separate delivery note.  Patient had a routine postpartum course. Patient is discharged home 01/28/20.  Newborn Data: Birth date:01/26/2020  Birth time:10:19 AM  Gender:Female  Living status:Living  Apgars:8 ,9  Weight:2910 g   Magnesium Sulfate received: No BMZ received: No Rhophylac:No MMR:No T-DaP:declined Flu: No Transfusion:No  Physical exam  Vitals:   01/27/20 0506 01/27/20 1419 01/27/20 2245 01/28/20 0517  BP: 118/81 130/77 123/77 109/83  Pulse: 87  79 80  Resp: _0 Temp: 97.7 F (36.5 C) 97.9 F (36.6 C) 98.1 F (36.7 C) 97.9 F (36.6 C)  TempSrc: Oral Oral Oral Oral  SpO2: 99%     Weight:      Height:       General: alert, cooperative and no distress Lochia: appropriate Uterine Fundus: firm Incision: N/A DVT Evaluation: No  evidence of DVT seen on physical exam. Negative Homan's sign. No cords or calf tenderness. Labs: Lab Results  Component Value Date   WBC 9.0 01/27/2020   HGB 10.9 (L) 01/27/2020   HCT 33.3 (L) 01/27/2020   MCV 97.7 01/27/2020   PLT 166 01/27/2020   CMP Latest Ref Rng & Units 01/25/2020  Glucose 70 - 99 mg/dL 80  BUN 6 - 20 mg/dL 9  Creatinine 0.44 - 1.00 mg/dL 0.65  Sodium 135 - 145 mmol/L 137  Potassium 3.5 - 5.1 mmol/L 4.3  Chloride 98 - 111 mmol/L 104  CO2 22 - 32 mmol/L 18(L)  Calcium 8.9 - 10.3 mg/dL 9.2  Total Protein 6.5 - 8.1 g/dL 6.4(L)  Total Bilirubin 0.3 - 1.2 mg/dL 0.6  Alkaline Phos 38 - 126 U/L 128(H)  AST 15 - 41 U/L 21  ALT 0 - 44 U/L 16   Edinburgh Score: Edinburgh Postnatal Depression Scale Screening Tool 01/27/2020  I have been able to laugh and see the funny side of things. 0  I have looked forward with enjoyment to things. 0  I have blamed myself unnecessarily when things went wrong. 2  I have been anxious or worried for no good reason. 2  I have felt scared or panicky for no good reason. 2  Things have been getting on top of me. 2  I have been so unhappy that I have  had difficulty sleeping. 2  I have felt sad or miserable. 0  I have been so unhappy that I have been crying. 0  The thought of harming myself has occurred to me. 0  Edinburgh Postnatal Depression Scale Total 10      After visit meds:  Allergies as of 01/28/2020      Reactions   Banana Swelling   Erythromycin    Upset stmach      Medication List    STOP taking these medications   aspirin 81 MG chewable tablet   Norethindrone Acetate-Ethinyl Estradiol 1.5-30 MG-MCG tablet Commonly known as: Loestrin 1.5/30 (21)     TAKE these medications   docusate sodium 100 MG capsule Commonly known as: Colace Take 1 capsule (100 mg total) by mouth 2 (two) times daily.   ferrous sulfate 325 (65 FE) MG tablet Take 1 tablet (325 mg total) by mouth daily with breakfast.   fluticasone 50  MCG/ACT nasal spray Commonly known as: FLONASE as needed.   ibuprofen 800 MG tablet Commonly known as: ADVIL Take 1 tablet (800 mg total) by mouth every 8 (eight) hours as needed. What changed:   medication strength  how much to take  when to take this  reasons to take this   levothyroxine 88 MCG tablet Commonly known as: SYNTHROID Take 88 mcg by mouth daily before breakfast.   prenatal multivitamin Tabs tablet Take 1 tablet by mouth daily at 12 noon.        Discharge home in stable condition Infant Feeding: Breast Infant Disposition:home with mother Discharge instruction: per After Visit Summary and Postpartum booklet. Activity: Advance as tolerated. Pelvic rest for 6 weeks.  Diet: routine diet Anticipated Birth Control: Unsure Postpartum Appointment:6 weeks Future Appointments:No future appointments. Follow up Visit:      01/28/2020 Deliah Boston, MD

## 2020-01-28 NOTE — Progress Notes (Signed)
Reviewed discharge instructiond with patient regarding medications, postpartum, when to call MD/go to [facility]U. Pre e signs and symptoms discussed with patient. Patient and significant other asked appropriate questions. Follow up with MD to be scheduled by patient.

## 2020-02-07 ENCOUNTER — Inpatient Hospital Stay (HOSPITAL_COMMUNITY): Payer: Self-pay

## 2020-02-07 ENCOUNTER — Inpatient Hospital Stay (HOSPITAL_COMMUNITY): Admission: RE | Admit: 2020-02-07 | Payer: Self-pay | Source: Home / Self Care | Admitting: Obstetrics and Gynecology

## 2020-12-11 ENCOUNTER — Other Ambulatory Visit: Payer: Self-pay

## 2020-12-11 ENCOUNTER — Ambulatory Visit (INDEPENDENT_AMBULATORY_CARE_PROVIDER_SITE_OTHER): Payer: Self-pay | Admitting: Physician Assistant

## 2020-12-11 ENCOUNTER — Encounter: Payer: Self-pay | Admitting: Physician Assistant

## 2020-12-11 VITALS — BP 118/70 | HR 97 | Temp 97.0°F | Ht 67.5 in | Wt 274.0 lb

## 2020-12-11 DIAGNOSIS — E038 Other specified hypothyroidism: Secondary | ICD-10-CM

## 2020-12-11 DIAGNOSIS — F988 Other specified behavioral and emotional disorders with onset usually occurring in childhood and adolescence: Secondary | ICD-10-CM

## 2020-12-11 DIAGNOSIS — E221 Hyperprolactinemia: Secondary | ICD-10-CM | POA: Insufficient documentation

## 2020-12-11 DIAGNOSIS — E039 Hypothyroidism, unspecified: Secondary | ICD-10-CM | POA: Insufficient documentation

## 2020-12-11 MED ORDER — ATOMOXETINE HCL 40 MG PO CAPS: 40.0000 mg | ORAL_CAPSULE | Freq: Every day | ORAL | 1 refills | Status: DC

## 2020-12-11 NOTE — Progress Notes (Signed)
New Patient Office Visit  Subjective:  Patient ID: Felicia Burns, female    DOB: 1991-05-11  Age: 30 y.o. MRN: [id number]  CC:  Chief Complaint  Patient presents with  . ADD    HPI Felicia Burns presents for hypothyrodism - pt was diagnosed about a year ago - sees endocrinologist regularly and is currently on synthroid qd  They also fill phentermine 15mg  qd for pt for weight loss  Pt with history of ADD - she currently follows with a therapist regularly for PTSD and was advised to inquired about medication - trouble with focusing, staying on task -- states she did have issues as child as well but never on medication  Past Medical History:  Diagnosis Date  . Abnormal uterine bleeding   . Asthma    as a child/situational now  . Decreased fetal movement 01/25/2020  . Dysmenorrhea   . Hypothyroidism     Past Surgical History:  Procedure Laterality Date  . BUNIONECTOMY  2002   left  . skyla iud insertion    . TONSILLECTOMY  1998    Family History  Problem Relation Age of Onset  . Bipolar disorder Mother   . Fibroids Mother        embedded fibroids and ovarian cyst  . Thyroid disease Mother   . Hypertension Mother        ?  2003 Bipolar disorder Brother   . Diabetes Maternal Grandmother        prediabetic  . Endometriosis Maternal Grandmother   . Diabetes Paternal Grandfather   . Prostate cancer Paternal Grandfather   . Heart disease Paternal Grandfather   . Breast cancer Other        maternal great grandmother    Social History   Socioeconomic History  . Marital status: Married    Spouse name: Not on file  . Number of children: Not on file  . Years of education: Not on file  . Highest education level: Not on file  Occupational History  . Not on file  Tobacco Use  . Smoking status: Never Smoker  . Smokeless tobacco: Never Used  Substance and Sexual Activity  . Alcohol use: Not Currently    Alcohol/week: 1.0 - 3.0 standard drink     Types: 1 - 3 Standard drinks or equivalent per week  . Drug use: No  . Sexual activity: Yes    Partners: Male    Birth control/protection: None  Other Topics Concern  . Not on file  Social History Narrative  . Not on file   Social Determinants of Health   Financial Resource Strain: Not on file  Food Insecurity: Not on file  Transportation Needs: Not on file  Physical Activity: Not on file  Stress: Not on file  Social Connections: Not on file  Intimate Partner Violence: Not on file     Current Outpatient Medications:  .  albuterol (VENTOLIN HFA) 108 (90 Base) MCG/ACT inhaler, SMARTSIG:2 Puff(s) By Mouth 4 Times Daily, Disp: , Rfl:  .  atomoxetine (STRATTERA) 40 MG capsule, Take 1 capsule (40 mg total) by mouth daily., Disp: 30 capsule, Rfl: 1 .  ferrous sulfate 325 (65 FE) MG tablet, Take 1 tablet (325 mg total) by mouth daily with breakfast., Disp: 30 tablet, Rfl: 1 .  ibuprofen (ADVIL) 800 MG tablet, Take 1 tablet (800 mg total) by mouth every 8 (eight) hours as needed., Disp: 60 tablet, Rfl: 1 .  levothyroxine (SYNTHROID) 75 MCG tablet,  TAKE 1 TABLET BY MOUTH FIRST THING IN THE MORNING ON AN EMPTY STOMACH ONCE DAILY., Disp: , Rfl:  .  phentermine 15 MG capsule, 1 capsule, Disp: , Rfl:  .  Prenatal Vit-Fe Fumarate-FA (PRENATAL MULTIVITAMIN) TABS tablet, Take 1 tablet by mouth daily at 12 noon., Disp: , Rfl:  .  Probiotic CHEW, See admin instructions., Disp: , Rfl:    Allergies  Allergen Reactions  . Banana Swelling and Other (See Comments)  . Azithromycin Other (See Comments)    Upset stomach  . Erythromycin Other (See Comments)    Upset stmach Upset stmach    ROS CONSTITUTIONAL: Negative for chills, fatigue, fever, unintentional weight gain and unintentional weight loss.  CARDIOVASCULAR: Negative for chest pain, dizziness, palpitations and pedal edema.  RESPIRATORY: Negative for recent cough and dyspnea.  PSYCHIATRIC: Negative for sleep disturbance and to question  depression screen.  Negative for depression, negative for anhedonia.        Objective:    PHYSICAL EXAM:   VS: Pulse 97   Temp (!) 97 F (36.1 C) (Temporal)   Ht 5' 7.5" (1.715 m)   Wt 274 lb (124.3 kg)   SpO2 97%   BMI 42.28 kg/m   GEN: Well nourished, well developed, in no acute distress  Cardiac: RRR; no murmurs, rubs, or gallops,no edema -  Respiratory:  normal respiratory rate and pattern with no distress - normal breath sounds with no rales, rhonchi, wheezes or rubs Psych: euthymic mood, appropriate affect and demeanor  Pulse 97   Temp (!) 97 F (36.1 C) (Temporal)   Ht 5' 7.5" (1.715 m)   Wt 274 lb (124.3 kg)   SpO2 97%   BMI 42.28 kg/m  Wt Readings from Last 3 Encounters:  12/11/20 274 lb (124.3 kg)  01/25/20 293 lb (132.9 kg)  11/24/19 281 lb 8 oz (127.7 kg)     Health Maintenance Due  Topic Date Due  . Hepatitis C Screening  Never done  . COVID-19 Vaccine (1) Never done  . PAP SMEAR-Modifier  01/26/2016    There are no preventive care reminders to display for this patient.  Lab Results  Component Value Date   TSH 3.792 09/04/2015   Lab Results  Component Value Date   WBC 9.0 01/27/2020   HGB 10.9 (L) 01/27/2020   HCT 33.3 (L) 01/27/2020   MCV 97.7 01/27/2020   PLT 166 01/27/2020   Lab Results  Component Value Date   NA 137 01/25/2020   K 4.3 01/25/2020   CO2 18 (L) 01/25/2020   GLUCOSE 80 01/25/2020   BUN 9 01/25/2020   CREATININE 0.65 01/25/2020   BILITOT 0.6 01/25/2020   ALKPHOS 128 (H) 01/25/2020   AST 21 01/25/2020   ALT 16 01/25/2020   PROT 6.4 (L) 01/25/2020   ALBUMIN 3.2 (L) 01/25/2020   CALCIUM 9.2 01/25/2020   ANIONGAP 15 01/25/2020   No results found for: CHOL No results found for: HDL No results found for: LDLCALC No results found for: TRIG No results found for: CHOLHDL No results found for: JGGE3M    Assessment & Plan:   Problem List Items Addressed This Visit      Endocrine   Hypothyroidism - Primary    Relevant Medications   levothyroxine (SYNTHROID) 75 MCG tablet     Other   Attention deficit disorder (ADD) without hyperactivity   Relevant Medications   atomoxetine (STRATTERA) 40 MG capsule      Meds ordered this encounter  Medications  .  atomoxetine (STRATTERA) 40 MG capsule    Sig: Take 1 capsule (40 mg total) by mouth daily.    Dispense:  30 capsule    Refill:  1    Order Specific Question:   Supervising Provider    Answer:   Corey Harold    Follow-up: Return in about 2 months (around 02/10/2021) for follow up.    SARA R Kimberlin Scheel, PA-C

## 2021-01-11 ENCOUNTER — Encounter: Payer: Self-pay | Admitting: Physician Assistant

## 2021-01-15 ENCOUNTER — Other Ambulatory Visit: Payer: Self-pay | Admitting: Physician Assistant

## 2021-01-15 DIAGNOSIS — F988 Other specified behavioral and emotional disorders with onset usually occurring in childhood and adolescence: Secondary | ICD-10-CM

## 2021-01-15 MED ORDER — ATOMOXETINE HCL 40 MG PO CAPS: 40.0000 mg | ORAL_CAPSULE | Freq: Every day | ORAL | 1 refills | Status: DC

## 2021-01-15 NOTE — Telephone Encounter (Signed)
Rx was sent  

## 2021-02-11 ENCOUNTER — Other Ambulatory Visit: Payer: Self-pay

## 2021-02-11 ENCOUNTER — Ambulatory Visit: Payer: Self-pay | Admitting: Physician Assistant

## 2021-02-11 ENCOUNTER — Encounter: Payer: Self-pay | Admitting: Physician Assistant

## 2021-02-11 VITALS — BP 118/74 | HR 95 | Temp 97.3°F | Ht 67.5 in | Wt 267.0 lb

## 2021-02-11 DIAGNOSIS — E038 Other specified hypothyroidism: Secondary | ICD-10-CM

## 2021-02-11 DIAGNOSIS — R5383 Other fatigue: Secondary | ICD-10-CM | POA: Insufficient documentation

## 2021-02-11 DIAGNOSIS — F988 Other specified behavioral and emotional disorders with onset usually occurring in childhood and adolescence: Secondary | ICD-10-CM

## 2021-02-11 MED ORDER — ATOMOXETINE HCL 60 MG PO CAPS: 60.0000 mg | ORAL_CAPSULE | Freq: Every day | ORAL | 2 refills | Status: DC

## 2021-02-11 NOTE — Progress Notes (Signed)
Subjective:  Patient ID: Felicia Burns, female    DOB: Dec 19, 1990  Age: 30 y.o. MRN: [id number]  Chief Complaint  Patient presents with   Hypothyroidism    HPI  Pt with history of hypothyroidism - currently on levothyroxine qd - states recently has been tired and has noted mild hair loss  Pt with history of ADD - is doing well on strattera 40mg  but feels could benefit from slightly increased dose Current Outpatient Medications on File Prior to Visit  Medication Sig Dispense Refill   albuterol (VENTOLIN HFA) 108 (90 Base) MCG/ACT inhaler SMARTSIG:2 Puff(s) By Mouth 4 Times Daily     ibuprofen (ADVIL) 800 MG tablet Take 1 tablet (800 mg total) by mouth every 8 (eight) hours as needed. 60 tablet 1   levothyroxine (SYNTHROID) 75 MCG tablet TAKE 1 TABLET BY MOUTH FIRST THING IN THE MORNING ON AN EMPTY STOMACH ONCE DAILY.     Prenatal Vit-Fe Fumarate-FA (PRENATAL MULTIVITAMIN) TABS tablet Take 1 tablet by mouth daily at 12 noon.     Probiotic CHEW See admin instructions.     No current facility-administered medications on file prior to visit.   Past Medical History:  Diagnosis Date   Abnormal uterine bleeding    Asthma    as a child/situational now   Decreased fetal movement 01/25/2020   Dysmenorrhea    Hypothyroidism    Past Surgical History:  Procedure Laterality Date   BUNIONECTOMY  2002   left   skyla iud insertion     TONSILLECTOMY  1998    Family History  Problem Relation Age of Onset   Bipolar disorder Mother    Fibroids Mother        embedded fibroids and ovarian cyst   Thyroid disease Mother    Hypertension Mother        ?   Bipolar disorder Brother    Diabetes Maternal Grandmother        prediabetic   Endometriosis Maternal Grandmother    Diabetes Paternal Grandfather    Prostate cancer Paternal Grandfather    Heart disease Paternal Grandfather    Breast cancer Other        maternal great grandmother   Social History   Socioeconomic History    Marital status: Married    Spouse name: Not on file   Number of children: Not on file   Years of education: Not on file   Highest education level: Not on file  Occupational History   Not on file  Tobacco Use   Smoking status: Never   Smokeless tobacco: Never  Substance and Sexual Activity   Alcohol use: Not Currently    Alcohol/week: 1.0 - 3.0 standard drink    Types: 1 - 3 Standard drinks or equivalent per week   Drug use: No   Sexual activity: Yes    Partners: Male    Birth control/protection: None  Other Topics Concern   Not on file  Social History Narrative   Not on file   Social Determinants of Health   Financial Resource Strain: Not on file  Food Insecurity: Not on file  Transportation Needs: Not on file  Physical Activity: Not on file  Stress: Not on file  Social Connections: Not on file    Review of Systems CONSTITUTIONAL: see HPI CARDIOVASCULAR: Negative for chest pain, dizziness, palpitations and pedal edema.  RESPIRATORY: Negative for recent cough and dyspnea.  GASTROINTESTINAL: Negative for abdominal pain, acid reflux symptoms, constipation, diarrhea, nausea and  vomiting.   PSYCHIATRIC: Negative for sleep disturbance and to question depression screen.  Negative for depression, negative for anhedonia.       Objective:  BP 118/74 (BP Location: Left Arm, Patient Position: Sitting, Cuff Size: Normal)   Pulse 95   Temp (!) 97.3 F (36.3 C) (Temporal)   Ht 5' 7.5" (1.715 m)   Wt 267 lb (121.1 kg)   SpO2 97%   BMI 41.20 kg/m   BP/Weight 02/11/2021 12/11/2020 01/28/2020  Systolic BP 118 118 109  Diastolic BP 74 70 83  Wt. (Lbs) 267 274 -  BMI 41.2 42.28 -    Physical Exam PHYSICAL EXAM:   VS: BP 118/74 (BP Location: Left Arm, Patient Position: Sitting, Cuff Size: Normal)   Pulse 95   Temp (!) 97.3 F (36.3 C) (Temporal)   Ht 5' 7.5" (1.715 m)   Wt 267 lb (121.1 kg)   SpO2 97%   BMI 41.20 kg/m   GEN: Well nourished, well developed, in no  acute distress  Cardiac: RRR; no murmurs, rubs, or gallops,no edema - Respiratory:  normal respiratory rate and pattern with no distress - normal breath sounds with no rales, rhonchi, wheezes or rubs  Psych: euthymic mood, appropriate affect and demeanor  Diabetic Foot Exam - Simple   No data filed      Lab Results  Component Value Date   WBC 9.0 01/27/2020   HGB 10.9 (L) 01/27/2020   HCT 33.3 (L) 01/27/2020   PLT 166 01/27/2020   GLUCOSE 80 01/25/2020   ALT 16 01/25/2020   AST 21 01/25/2020   NA 137 01/25/2020   K 4.3 01/25/2020   CL 104 01/25/2020   CREATININE 0.65 01/25/2020   BUN 9 01/25/2020   CO2 18 (L) 01/25/2020   TSH 3.792 09/04/2015      Assessment & Plan:   1. Other specified hypothyroidism - Thyroid Panel With TSH  2. Other fatigue - CBC with Differential/Platelet - Comprehensive metabolic panel - Thyroid Panel With TSH  3. Attention deficit disorder (ADD) without hyperactivity - atomoxetine (STRATTERA) 60 MG capsule; Take 1 capsule (60 mg total) by mouth daily.  Dispense: 60 capsule; Refill: 2    Meds ordered this encounter  Medications   atomoxetine (STRATTERA) 60 MG capsule    Sig: Take 1 capsule (60 mg total) by mouth daily.    Dispense:  60 capsule    Refill:  2    Order Specific Question:   Supervising Provider    AnswerCorey Harold    Orders Placed This Encounter  Procedures   CBC with Differential/Platelet   Comprehensive metabolic panel   Thyroid Panel With TSH     Follow-up: Return in about 3 months (around 05/14/2021) for follow up.  An After Visit Summary was printed and given to the patient.  Jettie Pagan [facility] [phone]

## 2021-02-12 LAB — CBC WITH DIFFERENTIAL/PLATELET
Basophils Absolute: 0 10*3/uL (ref 0.0–0.2)
Basos: 0 %
EOS (ABSOLUTE): 0.1 10*3/uL (ref 0.0–0.4)
Eos: 2 %
Hematocrit: 41 % (ref 34.0–46.6)
Hemoglobin: 13.9 g/dL (ref 11.1–15.9)
Immature Grans (Abs): 0 10*3/uL (ref 0.0–0.1)
Immature Granulocytes: 0 %
Lymphocytes Absolute: 2.3 10*3/uL (ref 0.7–3.1)
Lymphs: 27 %
MCH: 31.2 pg (ref 26.6–33.0)
MCHC: 33.9 g/dL (ref 31.5–35.7)
MCV: 92 fL (ref 79–97)
Monocytes Absolute: 0.6 10*3/uL (ref 0.1–0.9)
Monocytes: 7 %
Neutrophils Absolute: 5.4 10*3/uL (ref 1.4–7.0)
Neutrophils: 64 %
Platelets: 273 10*3/uL (ref 150–450)
RBC: 4.46 x10E6/uL (ref 3.77–5.28)
RDW: 12 % (ref 11.7–15.4)
WBC: 8.6 10*3/uL (ref 3.4–10.8)

## 2021-02-12 LAB — COMPREHENSIVE METABOLIC PANEL
ALT: 23 IU/L (ref 0–32)
AST: 23 IU/L (ref 0–40)
Albumin/Globulin Ratio: 1.9 (ref 1.2–2.2)
Albumin: 4.3 g/dL (ref 3.8–4.8)
Alkaline Phosphatase: 94 IU/L (ref 44–121)
BUN/Creatinine Ratio: 15 (ref 9–23)
BUN: 12 mg/dL (ref 6–20)
Bilirubin Total: 0.2 mg/dL (ref 0.0–1.2)
CO2: 22 mmol/L (ref 20–29)
Calcium: 9.2 mg/dL (ref 8.7–10.2)
Chloride: 101 mmol/L (ref 96–106)
Creatinine, Ser: 0.82 mg/dL (ref 0.57–1.00)
Globulin, Total: 2.3 g/dL (ref 1.5–4.5)
Glucose: 106 mg/dL — ABNORMAL HIGH (ref 65–99)
Potassium: 4.8 mmol/L (ref 3.5–5.2)
Sodium: 139 mmol/L (ref 134–144)
Total Protein: 6.6 g/dL (ref 6.0–8.5)
eGFR: 97 mL/min/{1.73_m2} (ref >59)

## 2021-02-12 LAB — THYROID PANEL WITH TSH
Free Thyroxine Index: 1.7 (ref 1.2–4.9)
T3 Uptake Ratio: 25 % (ref 24–39)
T4, Total: 6.9 ug/dL (ref 4.5–12.0)
TSH: 1.82 u[IU]/mL (ref 0.450–4.500)

## 2021-04-21 ENCOUNTER — Encounter: Payer: Self-pay | Admitting: Physician Assistant

## 2021-04-30 ENCOUNTER — Ambulatory Visit: Payer: Self-pay | Admitting: Physician Assistant

## 2021-04-30 ENCOUNTER — Encounter: Payer: Self-pay | Admitting: Physician Assistant

## 2021-04-30 ENCOUNTER — Other Ambulatory Visit: Payer: Self-pay

## 2021-04-30 VITALS — BP 120/64 | HR 84 | Temp 97.3°F | Resp 18 | Ht 69.0 in | Wt 262.0 lb

## 2021-04-30 DIAGNOSIS — F988 Other specified behavioral and emotional disorders with onset usually occurring in childhood and adolescence: Secondary | ICD-10-CM

## 2021-04-30 MED ORDER — AMPHETAMINE-DEXTROAMPHETAMINE 10 MG PO TABS: 10.0000 mg | ORAL_TABLET | Freq: Every day | ORAL | 0 refills | Status: DC

## 2021-04-30 NOTE — Progress Notes (Signed)
Subjective:  Patient ID: Felicia Burns, female    DOB: 07-Jul-1991  Age: 30 y.o. MRN: [id number]  Chief Complaint  Patient presents with   Hypothyroidism    HPI  Pt with history of hypothyroidism - currently on levothyroxine qd - is following with endocrinologist and had recent labwork  Pt with history of ADD - states she has not tolerated the strattera and not getting good results with the medication - would like to try another medication to help with focus and productivity for job performance She had been on adipex but has stopped that medication Current Outpatient Medications on File Prior to Visit  Medication Sig Dispense Refill   albuterol (VENTOLIN HFA) 108 (90 Base) MCG/ACT inhaler SMARTSIG:2 Puff(s) By Mouth 4 Times Daily     atomoxetine (STRATTERA) 60 MG capsule Take 1 capsule (60 mg total) by mouth daily. 60 capsule 2   ibuprofen (ADVIL) 800 MG tablet Take 1 tablet (800 mg total) by mouth every 8 (eight) hours as needed. 60 tablet 1   levothyroxine (SYNTHROID) 75 MCG tablet TAKE 1 TABLET BY MOUTH FIRST THING IN THE MORNING ON AN EMPTY STOMACH ONCE DAILY.     Prenatal Vit-Fe Fumarate-FA (PRENATAL MULTIVITAMIN) TABS tablet Take 1 tablet by mouth daily at 12 noon.     Probiotic CHEW See admin instructions.     No current facility-administered medications on file prior to visit.   Past Medical History:  Diagnosis Date   Abnormal uterine bleeding    Asthma    as a child/situational now   Decreased fetal movement 01/25/2020   Dysmenorrhea    Hypothyroidism    Past Surgical History:  Procedure Laterality Date   BUNIONECTOMY  2002   left   skyla iud insertion     TONSILLECTOMY  1998    Family History  Problem Relation Age of Onset   Bipolar disorder Mother    Fibroids Mother        embedded fibroids and ovarian cyst   Thyroid disease Mother    Hypertension Mother        ?   Bipolar disorder Brother    Diabetes Maternal Grandmother        prediabetic    Endometriosis Maternal Grandmother    Diabetes Paternal Grandfather    Prostate cancer Paternal Grandfather    Heart disease Paternal Grandfather    Breast cancer Other        maternal great grandmother   Social History   Socioeconomic History   Marital status: Married    Spouse name: Not on file   Number of children: Not on file   Years of education: Not on file   Highest education level: Not on file  Occupational History   Not on file  Tobacco Use   Smoking status: Never   Smokeless tobacco: Never  Substance and Sexual Activity   Alcohol use: Not Currently    Alcohol/week: 1.0 - 3.0 standard drink    Types: 1 - 3 Standard drinks or equivalent per week   Drug use: No   Sexual activity: Yes    Partners: Male    Birth control/protection: None  Other Topics Concern   Not on file  Social History Narrative   Not on file   Social Determinants of Health   Financial Resource Strain: Not on file  Food Insecurity: Not on file  Transportation Needs: Not on file  Physical Activity: Not on file  Stress: Not on file  Social Connections:  Not on file    CONSTITUTIONAL: Negative for chills, fatigue, fever, unintentional weight gain and unintentional weight loss.  CARDIOVASCULAR: Negative for chest pain, dizziness, palpitations and pedal edema.  RESPIRATORY: Negative for recent cough and dyspnea.  INTEGUMENTARY: Negative for rash.  PSYCHIATRIC: Negative for sleep disturbance and to question depression screen.  Negative for depression, negative for anhedonia.           Objective:  BP 120/64   Pulse 84   Temp (!) 97.3 F (36.3 C)   Resp 18   Ht 5\' 9"  (1.753 m)   Wt 262 lb (118.8 kg)   LMP 04/23/2021 Comment: periods lasting 2 weeks  SpO2 98%   Breastfeeding No   BMI 38.69 kg/m   BP/Weight 04/30/2021 02/11/2021 12/11/2020  Systolic BP 120 118 118  Diastolic BP 64 74 70  Wt. (Lbs) 262 267 274  BMI 38.69 41.2 42.28  PHYSICAL EXAM:   VS: BP 120/64   Pulse 84   Temp  (!) 97.3 F (36.3 C)   Resp 18   Ht 5\' 9"  (1.753 m)   Wt 262 lb (118.8 kg)   LMP 04/23/2021 Comment: periods lasting 2 weeks  SpO2 98%   Breastfeeding No   BMI 38.69 kg/m   GEN: Well nourished, well developed, in no acute distress  Cardiac: RRR; no murmurs, rubs, or gallops,no edema Respiratory:  normal respiratory rate and pattern with no distress - normal breath sounds with no rales, rhonchi, wheezes or rubs Psych: euthymic mood, appropriate affect and demeanor  Diabetic Foot Exam - Simple   No data filed      Lab Results  Component Value Date   WBC 8.6 02/11/2021   HGB 13.9 02/11/2021   HCT 41.0 02/11/2021   PLT 273 02/11/2021   GLUCOSE 106 (H) 02/11/2021   ALT 23 02/11/2021   AST 23 02/11/2021   NA 139 02/11/2021   K 4.8 02/11/2021   CL 101 02/11/2021   CREATININE 0.82 02/11/2021   BUN 12 02/11/2021   CO2 22 02/11/2021   TSH 1.820 02/11/2021      Assessment & Plan:   1. Attention deficit disorder (ADD) without hyperactivity - amphetamine-dextroamphetamine (ADDERALL) 10 MG tablet; Take 1 tablet (10 mg total) by mouth daily.  Dispense: 30 tablet; Refill: 0    Meds ordered this encounter  Medications   amphetamine-dextroamphetamine (ADDERALL) 10 MG tablet    Sig: Take 1 tablet (10 mg total) by mouth daily.    Dispense:  30 tablet    Refill:  0    Order Specific Question:   Supervising Provider    Answer:   02/13/2021    No orders of the defined types were placed in this encounter.    Follow-up: Return in about 3 months (around 07/30/2021) for chronic follow up.  An After Visit Summary was printed and given to the patient.  Corey Harold [facility] [phone]

## 2021-05-21 ENCOUNTER — Ambulatory Visit: Payer: Self-pay | Admitting: Physician Assistant

## 2021-05-28 ENCOUNTER — Other Ambulatory Visit: Payer: Self-pay | Admitting: Physician Assistant

## 2021-05-28 ENCOUNTER — Telehealth: Payer: Self-pay

## 2021-05-28 DIAGNOSIS — F988 Other specified behavioral and emotional disorders with onset usually occurring in childhood and adolescence: Secondary | ICD-10-CM

## 2021-05-28 MED ORDER — AMPHETAMINE-DEXTROAMPHETAMINE 10 MG PO TABS: 10.0000 mg | ORAL_TABLET | Freq: Two times a day (BID) | ORAL | 0 refills | Status: DC

## 2021-05-28 NOTE — Telephone Encounter (Signed)
Patient notified of increased dose.  Instructed to recheck in the office in 2 months.

## 2021-05-28 NOTE — Telephone Encounter (Signed)
Laci called to give Kennon Rounds an update on her Adderall.  She reports that the medication last only 3-4 hours.  She has been taking 2 tablets on her long days and has found that this works very well.  On days that she is less busy she has been taking 1/2 tablet.  She called requesting a refill to [facility] and ask that 2 tablets daily be sent to [facility].  She can be reached at [phone].

## 2021-06-27 ENCOUNTER — Telehealth: Payer: Self-pay

## 2021-06-27 NOTE — Telephone Encounter (Signed)
Patient called and stated she was calling about Adderall, wanted to know about the conversation she had with provider about trying the extended release. She was told to call when she was due for a refill. Tried calling patient back to get more information, left voicemail for patient to call office back.

## 2021-06-28 ENCOUNTER — Other Ambulatory Visit: Payer: Self-pay | Admitting: Family Medicine

## 2021-06-28 DIAGNOSIS — F988 Other specified behavioral and emotional disorders with onset usually occurring in childhood and adolescence: Secondary | ICD-10-CM

## 2021-06-28 MED ORDER — AMPHETAMINE-DEXTROAMPHETAMINE 10 MG PO TABS: 10.0000 mg | ORAL_TABLET | Freq: Two times a day (BID) | ORAL | 0 refills | Status: DC

## 2021-07-14 ENCOUNTER — Telehealth: Payer: Self-pay | Admitting: Emergency Medicine

## 2021-07-14 DIAGNOSIS — J019 Acute sinusitis, unspecified: Secondary | ICD-10-CM

## 2021-07-14 MED ORDER — AMOXICILLIN-POT CLAVULANATE 875-125 MG PO TABS: 1.0000 | ORAL_TABLET | Freq: Two times a day (BID) | ORAL | 0 refills | Status: AC

## 2021-07-14 NOTE — Progress Notes (Signed)

## 2021-07-14 NOTE — Progress Notes (Signed)
I have spent 5 minutes in review of e-visit questionnaire, review and updating patient chart, medical decision making and response to patient.   Corwin Kuiken, PA-C    

## 2021-07-31 ENCOUNTER — Ambulatory Visit: Payer: Self-pay | Admitting: Physician Assistant

## 2021-07-31 ENCOUNTER — Other Ambulatory Visit: Payer: Self-pay

## 2021-07-31 ENCOUNTER — Encounter: Payer: Self-pay | Admitting: Physician Assistant

## 2021-07-31 VITALS — BP 120/66 | HR 96 | Temp 97.0°F | Ht 69.0 in | Wt 255.6 lb

## 2021-07-31 DIAGNOSIS — F988 Other specified behavioral and emotional disorders with onset usually occurring in childhood and adolescence: Secondary | ICD-10-CM

## 2021-07-31 DIAGNOSIS — E039 Hypothyroidism, unspecified: Secondary | ICD-10-CM

## 2021-07-31 MED ORDER — AMPHETAMINE-DEXTROAMPHETAMINE 20 MG PO TABS: 20.0000 mg | ORAL_TABLET | Freq: Two times a day (BID) | ORAL | 0 refills | Status: DC

## 2021-07-31 NOTE — Progress Notes (Signed)
Subjective:  Patient ID: Felicia Burns, female    DOB: [birth year]  Age: 30 y.o. MRN: [id number]  Chief Complaint  Patient presents with   ADD    HPI  Pt with history of ADD - states that she does not feel the adderall 10mg  bid is completely helping - at times still having breakthrough symptoms and feeling like medication wearing off - would like to try different dosing  Pt with history of hypothyroidism - is due to check TSH - would like order to have done at another lab facility since she is self pay Current Outpatient Medications on File Prior to Visit  Medication Sig Dispense Refill   ibuprofen (ADVIL) 800 MG tablet Take 1 tablet (800 mg total) by mouth every 8 (eight) hours as needed. 60 tablet 1   levothyroxine (SYNTHROID) 75 MCG tablet TAKE 1 TABLET BY MOUTH FIRST THING IN THE MORNING ON AN EMPTY STOMACH ONCE DAILY.     Prenatal Vit-Fe Fumarate-FA (PRENATAL MULTIVITAMIN) TABS tablet Take 1 tablet by mouth daily at 12 noon.     Probiotic CHEW See admin instructions.     albuterol (VENTOLIN HFA) 108 (90 Base) MCG/ACT inhaler SMARTSIG:2 Puff(s) By Mouth 4 Times Daily     No current facility-administered medications on file prior to visit.   Past Medical History:  Diagnosis Date   Abnormal uterine bleeding    Asthma    as a child/situational now   Decreased fetal movement 01/25/2020   Dysmenorrhea    Hypothyroidism    Past Surgical History:  Procedure Laterality Date   BUNIONECTOMY  2002   left   skyla iud insertion     TONSILLECTOMY  1998    Family History  Problem Relation Age of Onset   Bipolar disorder Mother    Fibroids Mother        embedded fibroids and ovarian cyst   Thyroid disease Mother    Hypertension Mother        ?   Bipolar disorder Brother    Diabetes Maternal Grandmother        prediabetic   Endometriosis Maternal Grandmother    Diabetes Paternal Grandfather    Prostate cancer Paternal Grandfather    Heart disease Paternal Grandfather     Breast cancer Other        maternal great grandmother   Social History   Socioeconomic History   Marital status: Married    Spouse name: Not on file   Number of children: Not on file   Years of education: Not on file   Highest education level: Not on file  Occupational History   Not on file  Tobacco Use   Smoking status: Never   Smokeless tobacco: Never  Substance and Sexual Activity   Alcohol use: Not Currently    Alcohol/week: 1.0 - 3.0 standard drink    Types: 1 - 3 Standard drinks or equivalent per week   Drug use: No   Sexual activity: Yes    Partners: Male    Birth control/protection: None  Other Topics Concern   Not on file  Social History Narrative   Not on file   Social Determinants of Health   Financial Resource Strain: Not on file  Food Insecurity: Not on file  Transportation Needs: Not on file  Physical Activity: Not on file  Stress: Not on file  Social Connections: Not on file    Review of Systems  CONSTITUTIONAL: Negative for chills, fatigue, fever, unintentional weight gain and  unintentional weight loss.  CARDIOVASCULAR: Negative for chest pain, dizziness, palpitations and pedal edema.  RESPIRATORY: Negative for recent cough and dyspnea.  PSYCHIATRIC: see HPI     Objective:  BP 120/66 (BP Location: Left Arm, Patient Position: Sitting, Cuff Size: Normal)    Pulse 96    Temp (!) 97 F (36.1 C) (Temporal)    Ht 5\' 9"  (1.753 m)    Wt 255 lb 9.6 oz (115.9 kg)    SpO2 99%    BMI 37.75 kg/m   BP/Weight 07/31/2021 04/30/2021 02/11/2021  Systolic BP 120 120 118  Diastolic BP 66 64 74  Wt. (Lbs) 255.6 262 267  BMI 37.75 38.69 41.2    Physical Exam PHYSICAL EXAM:   VS: BP 120/66 (BP Location: Left Arm, Patient Position: Sitting, Cuff Size: Normal)    Pulse 96    Temp (!) 97 F (36.1 C) (Temporal)    Ht 5\' 9"  (1.753 m)    Wt 255 lb 9.6 oz (115.9 kg)    SpO2 99%    BMI 37.75 kg/m   GEN: Well nourished, well developed, in no acute distress  Cardiac:  RRR; no murmurs, rubs, or gallops,no edema -  Respiratory:  normal respiratory rate and pattern with no distress - normal breath sounds with no rales, rhonchi, wheezes or rubs Psych: euthymic mood, appropriate affect and demeanor  Diabetic Foot Exam - Simple   No data filed      Lab Results  Component Value Date   WBC 8.6 02/11/2021   HGB 13.9 02/11/2021   HCT 41.0 02/11/2021   PLT 273 02/11/2021   GLUCOSE 106 (H) 02/11/2021   ALT 23 02/11/2021   AST 23 02/11/2021   NA 139 02/11/2021   K 4.8 02/11/2021   CL 101 02/11/2021   CREATININE 0.82 02/11/2021   BUN 12 02/11/2021   CO2 22 02/11/2021   TSH 1.820 02/11/2021      Assessment & Plan:   Problem List Items Addressed This Visit       Endocrine   Hypothyroidism - Primary   Relevant Orders   TSH     Other   Attention deficit disorder (ADD) without hyperactivity   Relevant Medications   amphetamine-dextroamphetamine (ADDERALL) 20 MG tablet  .  Meds ordered this encounter  Medications   amphetamine-dextroamphetamine (ADDERALL) 20 MG tablet    Sig: Take 1 tablet (20 mg total) by mouth 2 (two) times daily.    Dispense:  60 tablet    Refill:  0    Order Specific Question:   Supervising Provider    Answer06/29/2022    Orders Placed This Encounter  Procedures   TSH     Follow-up: Return in about 3 months (around 10/29/2021) for follow up.  An After Visit Summary was printed and given to the patient.  [facility] [phone]

## 2021-08-06 ENCOUNTER — Telehealth: Payer: Self-pay | Admitting: Physician Assistant

## 2021-08-06 DIAGNOSIS — J329 Chronic sinusitis, unspecified: Secondary | ICD-10-CM

## 2021-08-06 NOTE — Progress Notes (Signed)
Based on what you shared with me, I feel your condition warrants further evaluation and I recommend that you be seen in a face to face visit.  At this point, giving ongoing symptoms despite course of antibiotic treatment from prior e-visit, you need to be evaluated in person for a good detailed examination to determine if this is a continued bacterial infection or if there is another secondary cause of ongoing symptoms. This way the proper treatment can be given.    NOTE: There will be NO CHARGE for this eVisit   If you are having a true medical emergency please call 911.      For an urgent face to face visit, [facility] has six urgent care centers for your convenience:     [facility] at [facility] Directions [phone] [address] [place], [place] [address]    [facility] Urgent Care Center [facility]) Get Driving Directions [phone] [address] [place], [place] [address]  [facility] Health Urgent Care Center [facility]) Get Driving Directions [phone] [address] [place],  [place]  [address]  [facility] at [facility] Get Driving Directions [phone] [address] [place], [place] [address]   [facility] Health Urgent Care at [facility] Get Driving Directions  [phone] [address] [place], [place] [address]   [facility] Health Urgent Care at [facility] Directions [phone] [address], [address] [place], [place] [address]  Your MyChart E-visit questionnaire answers were reviewed by a board certified advanced clinical practitioner to complete your personal care plan based on your specific symptoms.  Thank you for using e-Visits.

## 2021-09-07 LAB — TSH: TSH: 3.08 u[IU]/mL (ref 0.450–4.500)

## 2021-09-09 ENCOUNTER — Other Ambulatory Visit: Payer: Self-pay

## 2021-09-09 DIAGNOSIS — F988 Other specified behavioral and emotional disorders with onset usually occurring in childhood and adolescence: Secondary | ICD-10-CM

## 2021-09-09 MED ORDER — AMPHETAMINE-DEXTROAMPHETAMINE 20 MG PO TABS: 20.0000 mg | ORAL_TABLET | Freq: Two times a day (BID) | ORAL | 0 refills | Status: DC

## 2021-09-09 MED ORDER — LEVOTHYROXINE SODIUM 75 MCG PO TABS: ORAL_TABLET | ORAL | 1 refills | Status: DC

## 2021-10-02 ENCOUNTER — Other Ambulatory Visit: Payer: Self-pay | Admitting: Physician Assistant

## 2021-10-02 DIAGNOSIS — F988 Other specified behavioral and emotional disorders with onset usually occurring in childhood and adolescence: Secondary | ICD-10-CM

## 2021-10-03 MED ORDER — AMPHETAMINE-DEXTROAMPHETAMINE 20 MG PO TABS: 20.0000 mg | ORAL_TABLET | Freq: Two times a day (BID) | ORAL | 0 refills | Status: DC

## 2021-10-30 ENCOUNTER — Ambulatory Visit: Payer: Self-pay | Admitting: Physician Assistant

## 2021-11-05 ENCOUNTER — Telehealth: Payer: Self-pay | Admitting: Family Medicine

## 2021-11-05 DIAGNOSIS — J019 Acute sinusitis, unspecified: Secondary | ICD-10-CM

## 2021-11-05 DIAGNOSIS — H9201 Otalgia, right ear: Secondary | ICD-10-CM

## 2021-11-05 DIAGNOSIS — B9689 Other specified bacterial agents as the cause of diseases classified elsewhere: Secondary | ICD-10-CM

## 2021-11-05 MED ORDER — AMOXICILLIN 500 MG PO CAPS: 500.0000 mg | ORAL_CAPSULE | Freq: Three times a day (TID) | ORAL | 0 refills | Status: DC

## 2021-11-05 NOTE — Patient Instructions (Signed)
Earache, Adult ?An earache, or ear pain, can be caused by many things, including: ?An infection. ?Ear wax buildup. ?Ear pressure. ?Something in the ear that should not be there (foreign body). ?A sore throat. ?Tooth problems. ?Jaw problems. ?Treatment of the earache will depend on the cause. If the cause is not clear or cannot be determined, you may need to watch your symptoms until your earache goes away or until a cause is found. ?Follow these instructions at home: ?Medicines ?Take or apply over-the-counter and prescription medicines only as told by your health care provider. ?If you were prescribed an antibiotic medicine, use it as told by your health care provider. Do not stop using the antibiotic even if you start to feel better. ?Do not put anything in your ear other than medicine that is prescribed by your health care provider. ?Managing pain ?If directed, apply heat to the affected area as often as told by your health care provider. Use the heat source that your health care provider recommends, such as a moist heat pack or a heating pad. ?Place a towel between your skin and the heat source. ?Leave the heat on for 20-30 minutes. ?Remove the heat if your skin turns bright red. This is especially important if you are unable to feel pain, heat, or cold. You may have a greater risk of getting burned. ?If directed, put ice on the affected area as often as told by your health care provider. To do this: ?  ?Put ice in a plastic bag. ?Place a towel between your skin and the bag. ?Leave the ice on for 20 minutes, 2-3 times a day. ?General instructions ?Pay attention to any changes in your symptoms. ?Try resting in an upright position instead of lying down. This may help to reduce pressure in your ear and relieve pain. ?Chew gum if it helps to relieve your ear pain. ?Treat any allergies as told by your health care provider. ?Drink enough fluid to keep your urine pale yellow. ?It is up to you to get the results of any  tests that were done. Ask your health care provider, or the department that is doing the tests, when your results will be ready. ?Keep all follow-up visits as told by your health care provider. This is important. ?Contact a health care provider if: ?Your pain does not improve within 2 days. ?Your earache gets worse. ?You have new symptoms. ?You have a fever. ?Get help right away if you: ?Have a severe headache. ?Have a stiff neck. ?Have trouble swallowing. ?Have redness or swelling behind your ear. ?Have fluid or blood coming from your ear. ?Have hearing loss. ?Feel dizzy. ?Summary ?An earache, or ear pain, can be caused by many things. ?Treatment of the earache will depend on the cause. Follow recommendations from your health care provider to treat your ear pain. ?If the cause is not clear or cannot be determined, you may need to watch your symptoms until your earache goes away or until a cause is found. ?Keep all follow-up visits as told by your health care provider. This is important. ?This information is not intended to replace advice given to you by your health care provider. Make sure you discuss any questions you have with your health care provider. ?Document Revised: 03/12/2019 Document Reviewed: 03/12/2019 ?Elsevier Patient Education ? 2022 Elsevier [employer] ?Sinusitis, Adult ?Sinusitis is soreness and swelling (inflammation) of your sinuses. Sinuses are hollow spaces in the bones around your face. They are located: ?Around your eyes. ?In the middle  of your forehead. ?Behind your nose. ?In your cheekbones. ?Your sinuses and nasal passages are lined with a fluid called mucus. Mucus drains out of your sinuses. Swelling can trap mucus in your sinuses. This lets germs (bacteria, virus, or fungus) grow, which leads to infection. Most of the time, this condition is caused by a virus. ?What are the causes? ?This condition is caused by: ?Allergies. ?Asthma. ?Germs. ?Things that block your nose or sinuses. ?Growths in  the nose (nasal polyps). ?Chemicals or irritants in the air. ?Fungus (rare). ?What increases the risk? ?You are more likely to develop this condition if: ?You have a weak body defense system (immune system). ?You do a lot of swimming or diving. ?You use nasal sprays too much. ?You smoke. ?What are the signs or symptoms? ?The main symptoms of this condition are pain and a feeling of pressure around the sinuses. Other symptoms include: ?Stuffy nose (congestion). ?Runny nose (drainage). ?Swelling and warmth in the sinuses. ?Headache. ?Toothache. ?A cough that may get worse at night. ?Mucus that collects in the throat or the back of the nose (postnasal drip). ?Being unable to smell and taste. ?Being very tired (fatigue). ?A fever. ?Sore throat. ?Bad breath. ?How is this diagnosed? ?This condition is diagnosed based on: ?Your symptoms. ?Your medical history. ?A physical exam. ?Tests to find out if your condition is short-term (acute) or long-term (chronic). Your doctor may: ?Check your nose for growths (polyps). ?Check your sinuses using a tool that has a light (endoscope). ?Check for allergies or germs. ?Do imaging tests, such as an MRI or CT scan. ?How is this treated? ?Treatment for this condition depends on the cause and whether it is short-term or long-term. ?If caused by a virus, your symptoms should go away on their own within 10 days. You may be given medicines to relieve symptoms. They include: ?Medicines that shrink swollen tissue in the nose. ?Medicines that treat allergies (antihistamines). ?A spray that treats swelling of the nostrils.  ?Rinses that help get rid of thick mucus in your nose (nasal saline washes). ?If caused by bacteria, your doctor may wait to see if you will get better without treatment. You may be given antibiotic medicine if you have: ?A very bad infection. ?A weak body defense system. ?If caused by growths in the nose, you may need to have surgery. ?Follow these instructions at  home: ?Medicines ?Take, use, or apply over-the-counter and prescription medicines only as told by your doctor. These may include nasal sprays. ?If you were prescribed an antibiotic medicine, take it as told by your doctor. Do not stop taking the antibiotic even if you start to feel better. ?Hydrate and humidify ? ?Drink enough water to keep your pee (urine) pale yellow. ?Use a cool mist humidifier to keep the humidity level in your home above 50%. ?Breathe in steam for 10-15 minutes, 3-4 times a day, or as told by your doctor. You can do this in the bathroom while a hot shower is running. ?Try not to spend time in cool or dry air. ?Rest ?Rest as much as you can. ?Sleep with your head raised (elevated). ?Make sure you get enough sleep each night. ?General instructions ? ?Put a warm, moist washcloth on your face 3-4 times a day, or as often as told by your doctor. This will help with discomfort. ?Wash your hands often with soap and water. If there is no soap and water, use hand sanitizer. ?Do not smoke. Avoid being around people who are smoking (secondhand  smoke). ?Keep all follow-up visits as told by your doctor. This is important. ?Contact a doctor if: ?You have a fever. ?Your symptoms get worse. ?Your symptoms do not get better within 10 days. ?Get help right away if: ?You have a very bad headache. ?You cannot stop throwing up (vomiting). ?You have very bad pain or swelling around your face or eyes. ?You have trouble seeing. ?You feel confused. ?Your neck is stiff. ?You have trouble breathing. ?Summary ?Sinusitis is swelling of your sinuses. Sinuses are hollow spaces in the bones around your face. ?This condition is caused by tissues in your nose that become inflamed or swollen. This traps germs. These can lead to infection. ?If you were prescribed an antibiotic medicine, take it as told by your doctor. Do not stop taking it even if you start to feel better. ?Keep all follow-up visits as told by your doctor. This  is important. ?This information is not intended to replace advice given to you by your health care provider. Make sure you discuss any questions you have with your health care provider. ?Document Revised: 01/05/19

## 2021-11-05 NOTE — Progress Notes (Signed)
?Virtual Visit Consent  ? ?Felicia Burns, you are scheduled for a virtual visit with a [facility] provider today.   ?  ?Just as with appointments in the office, your consent must be obtained to participate.  Your consent will be active for this visit and any virtual visit you may have with one of our providers in the next 365 days.   ?  ?If you have a MyChart account, a copy of this consent can be sent to you electronically.  All virtual visits are billed to your insurance company just like a traditional visit in the office.   ? ?As this is a virtual visit, video technology does not allow for your provider to perform a traditional examination.  This may limit your provider's ability to fully assess your condition.  If your provider identifies any concerns that need to be evaluated in person or the need to arrange testing (such as labs, EKG, etc.), we will make arrangements to do so.   ?  ?Although advances in technology are sophisticated, we cannot ensure that it will always work on either your end or our end.  If the connection with a video visit is poor, the visit may have to be switched to a telephone visit.  With either a video or telephone visit, we are not always able to ensure that we have a secure connection.    ? ?I need to obtain your verbal consent now.   Are you willing to proceed with your visit today?  ?  ?Toi Stelly has provided verbal consent on 11/05/2021 for a virtual visit (video or telephone). ?  ?Georgana Curio, FNP  ? ?Date: 11/05/2021 6:57 PM ? ? ?Virtual Visit via Video Note  ? ?IGeorgana Curio, connected with  Felicia Burns  ([id number], 33-Oct-1992) on 11/05/21 at  6:45 PM EDT by a video-enabled telemedicine application and verified that I am speaking with the correct person using two identifiers. ? ?Location: ?Patient: Virtual Visit Location Patient: Home ?Provider: Virtual Visit Location Provider: Home Office ?  ?I discussed the limitations of evaluation and management  by telemedicine and the availability of in person appointments. The patient expressed understanding and agreed to proceed.   ? ?History of Present Illness: ?Felicia Burns is a 31 y.o. who identifies as a female who was assigned female at birth, and is being seen today for sinus pressure and pain over rt maxillary sinus with rt ear pain and pain in rt cheek. She has scheduled herself an apptmt with a dentist tomorrow. Denies fever and drainage from ear.  ? ?HPI: Sinusitis ?The current episode started in the past 7 days. There has been no fever. Her pain is at a severity of 4/10. The pain is moderate. Associated symptoms include congestion, ear pain and sinus pressure. Past treatments include sitting up. The treatment provided mild relief.  ?Otalgia  ?There is pain in the right ear. This is a new problem. The current episode started in the past 7 days. The problem occurs hourly. The problem has been gradually worsening. There has been no fever. The pain is at a severity of 4/10. The pain is moderate.   ?Problems:  ?Patient Active Problem List  ? Diagnosis Date Noted  ? Other fatigue 02/11/2021  ? Hyperprolactinemia (HCC) 12/11/2020  ? Hypothyroidism 12/11/2020  ? Attention deficit disorder (ADD) without hyperactivity 12/11/2020  ? SVD (spontaneous vaginal delivery) 01/26/2020  ? Decreased fetal movement 01/25/2020  ? Pregnant 01/25/2020  ? Morbid obesity (  HCC) 06/27/2014  ?  Class: History of  ? DUB (dysfunctional uterine bleeding) 05/08/2013  ?  ?Allergies:  ?Allergies  ?Allergen Reactions  ? Banana Swelling and Other (See Comments)  ? Azithromycin Other (See Comments)  ?  Upset stomach  ? Erythromycin Other (See Comments)  ?  Upset stmach ?Upset stmach  ? ?Medications:  ?Current Outpatient Medications:  ?  amphetamine-dextroamphetamine (ADDERALL) 20 MG tablet, Take 1 tablet (20 mg total) by mouth 2 (two) times daily., Disp: 60 tablet, Rfl: 0 ?  ibuprofen (ADVIL) 800 MG tablet, Take 1 tablet (800 mg total) by  mouth every 8 (eight) hours as needed., Disp: 60 tablet, Rfl: 1 ?  levothyroxine (SYNTHROID) 75 MCG tablet, TAKE 1 TABLET BY MOUTH FIRST THING IN THE MORNING ON AN EMPTY STOMACH ONCE DAILY., Disp: 90 tablet, Rfl: 1 ?  Prenatal Vit-Fe Fumarate-FA (PRENATAL MULTIVITAMIN) TABS tablet, Take 1 tablet by mouth daily at 12 noon., Disp: , Rfl:  ?  Probiotic CHEW, See admin instructions., Disp: , Rfl:  ? ?Observations/Objective: ?Patient is well-developed, well-nourished in no acute distress.  ?Resting comfortably at home.  ?Head is normocephalic, atraumatic.  ?No labored breathing.  ?Speech is clear and coherent with logical content.  ?Patient is alert and oriented at baseline.  ? ?Assessment and Plan: ?1. Acute bacterial sinusitis ? ?2. Right ear pain ? ?Instructed to keep f.u with dentist tomorrow. Follow up at urgent care if symptoms worsen. Heat packs to rt side of face. Continue ibuprofen as directed. Medication use and side effects discussed.  ? ?Follow Up Instructions: ?I discussed the assessment and treatment plan with the patient. The patient was provided an opportunity to ask questions and all were answered. The patient agreed with the plan and demonstrated an understanding of the instructions.  A copy of instructions were sent to the patient via MyChart unless otherwise noted below.  ? ? ? ?The patient was advised to call back or seek an in-person evaluation if the symptoms worsen or if the condition fails to improve as anticipated. ? ?Time:  ?I spent 10 minutes with the patient via telehealth technology discussing the above problems/concerns.   ? ?Georgana Curio, FNP ? ?

## 2021-11-13 ENCOUNTER — Ambulatory Visit: Payer: Self-pay | Admitting: Physician Assistant

## 2021-11-13 ENCOUNTER — Encounter: Payer: Self-pay | Admitting: Physician Assistant

## 2021-11-13 VITALS — BP 118/86 | HR 88 | Temp 98.0°F | Ht 69.0 in | Wt 246.0 lb

## 2021-11-13 DIAGNOSIS — F988 Other specified behavioral and emotional disorders with onset usually occurring in childhood and adolescence: Secondary | ICD-10-CM

## 2021-11-13 DIAGNOSIS — E039 Hypothyroidism, unspecified: Secondary | ICD-10-CM

## 2021-11-13 NOTE — Progress Notes (Signed)
? ?Subjective:  ?Patient ID: Felicia Burns, female    DOB: 1991/02/09  Age: 31 y.o. MRN: [id number] ? ?Chief Complaint  ?Patient presents with  ? medication follow up  ?  ADD  ? ? ?HPI ? Pt with history of hypothyroidism - currently on levothyroxine 71mcg qd - doing well and voices no concerns or problems - last TSH 1/23 normal ? ?Pt with history of ADD - stable on adderall 20mg  bid - medication working well with no side effects ?Current Outpatient Medications on File Prior to Visit  ?Medication Sig Dispense Refill  ? amphetamine-dextroamphetamine (ADDERALL) 20 MG tablet Take 1 tablet (20 mg total) by mouth 2 (two) times daily. 60 tablet 0  ? ibuprofen (ADVIL) 800 MG tablet Take 1 tablet (800 mg total) by mouth every 8 (eight) hours as needed. 60 tablet 1  ? levothyroxine (SYNTHROID) 75 MCG tablet TAKE 1 TABLET BY MOUTH FIRST THING IN THE MORNING ON AN EMPTY STOMACH ONCE DAILY. 90 tablet 1  ? Prenatal Vit-Fe Fumarate-FA (PRENATAL MULTIVITAMIN) TABS tablet Take 1 tablet by mouth daily at 12 noon.    ? Probiotic CHEW See admin instructions.    ? ?No current facility-administered medications on file prior to visit.  ? ?Past Medical History:  ?Diagnosis Date  ? Abnormal uterine bleeding   ? Asthma   ? as a child/situational now  ? Decreased fetal movement 01/25/2020  ? Dysmenorrhea   ? Hypothyroidism   ? ?Past Surgical History:  ?Procedure Laterality Date  ? BUNIONECTOMY  2002  ? left  ? skyla iud insertion    ? TONSILLECTOMY  1998  ?  ?Family History  ?Problem Relation Age of Onset  ? Bipolar disorder Mother   ? Fibroids Mother   ?     embedded fibroids and ovarian cyst  ? Thyroid disease Mother   ? Hypertension Mother   ?     ?  ? Bipolar disorder Brother   ? Diabetes Maternal Grandmother   ?     prediabetic  ? Endometriosis Maternal Grandmother   ? Diabetes Paternal Grandfather   ? Prostate cancer Paternal Grandfather   ? Heart disease Paternal Grandfather   ? Breast cancer Other   ?     maternal great  grandmother  ? ?Social History  ? ?Socioeconomic History  ? Marital status: Married  ?  Spouse name: Not on file  ? Number of children: Not on file  ? Years of education: Not on file  ? Highest education level: Not on file  ?Occupational History  ? Not on file  ?Tobacco Use  ? Smoking status: Never  ? Smokeless tobacco: Never  ?Substance and Sexual Activity  ? Alcohol use: Not Currently  ?  Alcohol/week: 1.0 - 3.0 standard drink  ?  Types: 1 - 3 Standard drinks or equivalent per week  ? Drug use: No  ? Sexual activity: Yes  ?  Partners: Male  ?  Birth control/protection: None  ?Other Topics Concern  ? Not on file  ?Social History Narrative  ? Not on file  ? ?Social Determinants of Health  ? ?Financial Resource Strain: Not on file  ?Food Insecurity: Not on file  ?Transportation Needs: Not on file  ?Physical Activity: Not on file  ?Stress: Not on file  ?Social Connections: Not on file  ? ? ?Review of Systems ?CONSTITUTIONAL: Negative for chills, fatigue, fever, unintentional weight gain and unintentional weight loss.  ?CARDIOVASCULAR: Negative for chest pain, dizziness, palpitations and pedal  edema.  ?RESPIRATORY: Negative for recent cough and dyspnea.  ?GASTROINTESTINAL: Negative for abdominal pain, acid reflux symptoms, constipation, diarrhea, nausea and vomiting.  ?PSYCHIATRIC: Negative for sleep disturbance and to question depression screen.  Negative for depression, negative for anhedonia.  ?   ? ? ?Objective:  ?BP 118/86   Pulse 88   Temp 98 ?F (36.7 ?C)   Ht 5\' 9"  (1.753 m)   Wt 246 lb (111.6 kg)   SpO2 99%   BMI 36.33 kg/m?  ? ? ?  11/13/2021  ?  2:26 PM 07/31/2021  ? 10:24 AM 04/30/2021  ? 10:56 AM  ?BP/Weight  ?Systolic BP 123456 [id number] [id number]  ?Diastolic BP 86 66 64  ?Wt. (Lbs) 246 255.6 262  ?BMI 36.33 kg/m2 37.75 kg/m2 38.69 kg/m2  ? ? ?Physical Exam ?PHYSICAL EXAM:  ? ?VS: BP 118/86   Pulse 88   Temp 98 ?F (36.7 ?C)   Ht 5\' 9"  (1.753 m)   Wt 246 lb (111.6 kg)   SpO2 99%   BMI 36.33 kg/m?  ? ?GEN: Well  nourished, well developed, in no acute distress  ?Cardiac: RRR; no murmurs,  ?Respiratory:  normal respiratory rate and pattern with no distress - normal breath sounds with no rales, rhonchi, wheezes or rubs ?Psych: euthymic mood, appropriate affect and demeanor ? ?Diabetic Foot Exam - Simple   ?No data filed ?  ?  ? ?Lab Results  ?Component Value Date  ? WBC 8.6 02/11/2021  ? HGB 13.9 02/11/2021  ? HCT 41.0 02/11/2021  ? PLT 273 02/11/2021  ? GLUCOSE 106 (H) 02/11/2021  ? ALT 23 02/11/2021  ? AST 23 02/11/2021  ? NA 139 02/11/2021  ? K 4.8 02/11/2021  ? CL 101 02/11/2021  ? CREATININE 0.82 02/11/2021  ? BUN 12 02/11/2021  ? CO2 22 02/11/2021  ? TSH 3.080 09/06/2021  ? ? ? ? ?Assessment & Plan:  ? ?Problem List Items Addressed This Visit   ? ?  ? Endocrine  ? Hypothyroidism - Primary ?Continue current meds  ?  ? Other  ? Attention deficit disorder (ADD) without hyperactivity ?Continue current meds  ?. ? ?No orders of the defined types were placed in this encounter. ? ? ?No orders of the defined types were placed in this encounter. ?  ? ?Follow-up: Return in about 5 months (around 04/15/2022) for chronic follow up. ? ?An After Visit Summary was printed and given to the patient. ? ?SARA R Gotham Raden, PA-C ?[facility] ?[phone] ?

## 2021-11-20 ENCOUNTER — Other Ambulatory Visit: Payer: Self-pay | Admitting: Physician Assistant

## 2021-11-20 DIAGNOSIS — F988 Other specified behavioral and emotional disorders with onset usually occurring in childhood and adolescence: Secondary | ICD-10-CM

## 2021-11-20 MED ORDER — AMPHETAMINE-DEXTROAMPHETAMINE 20 MG PO TABS: 20.0000 mg | ORAL_TABLET | Freq: Two times a day (BID) | ORAL | 0 refills | Status: DC

## 2021-11-21 ENCOUNTER — Other Ambulatory Visit: Payer: Self-pay | Admitting: Physician Assistant

## 2021-11-21 DIAGNOSIS — F988 Other specified behavioral and emotional disorders with onset usually occurring in childhood and adolescence: Secondary | ICD-10-CM

## 2021-11-21 MED ORDER — AMPHETAMINE-DEXTROAMPHETAMINE 20 MG PO TABS: 20.0000 mg | ORAL_TABLET | Freq: Two times a day (BID) | ORAL | 0 refills | Status: DC

## 2022-02-21 ENCOUNTER — Other Ambulatory Visit: Payer: Self-pay | Admitting: Physician Assistant

## 2022-02-21 DIAGNOSIS — F988 Other specified behavioral and emotional disorders with onset usually occurring in childhood and adolescence: Secondary | ICD-10-CM

## 2022-02-21 MED ORDER — AMPHETAMINE-DEXTROAMPHETAMINE 20 MG PO TABS: 20.0000 mg | ORAL_TABLET | Freq: Two times a day (BID) | ORAL | 0 refills | Status: DC

## 2022-02-21 NOTE — Telephone Encounter (Signed)
Pharmacy changed to [facility] at patient request.  Lorita Officer, CCMA 02/21/22 11:55 AM

## 2022-04-01 ENCOUNTER — Other Ambulatory Visit: Payer: Self-pay | Admitting: Physician Assistant

## 2022-04-01 NOTE — Telephone Encounter (Signed)
Please call pt - she is due for follow up appt

## 2022-04-02 NOTE — Telephone Encounter (Signed)
Called patient left message for patient to call office to schedule follow up appointment.

## 2022-04-09 ENCOUNTER — Telehealth: Payer: Self-pay

## 2022-04-09 NOTE — Telephone Encounter (Signed)
Left VM for patient to make appointment before order can be placed.  Lorita Officer, [place] 04/09/22 2:21 PM

## 2022-04-09 NOTE — Telephone Encounter (Signed)
Patient calling requesting lab orders for thyroid. She is aware she is due for appointment. She states she typically gets labs done at [facility] before appointment. Please advise.   Felicia Burns, [place] 04/09/22 10:23 AM

## 2022-04-10 ENCOUNTER — Other Ambulatory Visit: Payer: Self-pay

## 2022-04-10 ENCOUNTER — Other Ambulatory Visit: Payer: Self-pay | Admitting: Physician Assistant

## 2022-04-10 DIAGNOSIS — E039 Hypothyroidism, unspecified: Secondary | ICD-10-CM

## 2022-04-10 DIAGNOSIS — F988 Other specified behavioral and emotional disorders with onset usually occurring in childhood and adolescence: Secondary | ICD-10-CM

## 2022-04-10 MED ORDER — AMPHETAMINE-DEXTROAMPHETAMINE 20 MG PO TABS: 20.0000 mg | ORAL_TABLET | Freq: Two times a day (BID) | ORAL | 0 refills | Status: DC

## 2022-04-10 NOTE — Telephone Encounter (Signed)
Appointment has been scheduled for 09/15 at 11 AM. Patient is requesting a refill on Adderall to be sent to [facility] - [place], [place] - [address]

## 2022-05-02 ENCOUNTER — Ambulatory Visit (INDEPENDENT_AMBULATORY_CARE_PROVIDER_SITE_OTHER): Payer: Self-pay | Admitting: Physician Assistant

## 2022-05-02 ENCOUNTER — Other Ambulatory Visit: Payer: Self-pay | Admitting: Physician Assistant

## 2022-05-02 ENCOUNTER — Encounter: Payer: Self-pay | Admitting: Physician Assistant

## 2022-05-02 VITALS — BP 114/80 | HR 81 | Temp 97.4°F | Ht 69.0 in | Wt 237.2 lb

## 2022-05-02 DIAGNOSIS — E039 Hypothyroidism, unspecified: Secondary | ICD-10-CM

## 2022-05-02 DIAGNOSIS — F988 Other specified behavioral and emotional disorders with onset usually occurring in childhood and adolescence: Secondary | ICD-10-CM

## 2022-05-02 NOTE — Progress Notes (Signed)
Subjective:  Patient ID: Felicia Burns, female    DOB: 03-21-1991  Age: 31 y.o. MRN: [id number]  Chief Complaint  Patient presents with   Hypothyroidism    HPI  Pt with history of hypothyroidism - doing well on medication - she is currently taking synthroid qd - due for labwork and has done at another [facility] facility  Pt with history of ADD - symptoms stable on adderall 20mg  At times she feels she has some irritability for about an hour as she is coming off medication but at this time would like to continue this current medication Current Outpatient Medications on File Prior to Visit  Medication Sig Dispense Refill   amphetamine-dextroamphetamine (ADDERALL) 20 MG tablet Take 1 tablet (20 mg total) by mouth 2 (two) times daily. 60 tablet 0   ibuprofen (ADVIL) 800 MG tablet Take 1 tablet (800 mg total) by mouth every 8 (eight) hours as needed. 60 tablet 1   levothyroxine (SYNTHROID) 75 MCG tablet TAKE 1 TABLET BY MOUTH FIRST THING IN THE MORNING ON AN EMPTY STOMACH ONCE DAILY 90 tablet 0   Prenatal Vit-Fe Fumarate-FA (PRENATAL MULTIVITAMIN) TABS tablet Take 1 tablet by mouth daily at 12 noon.     Probiotic CHEW See admin instructions.     No current facility-administered medications on file prior to visit.   Past Medical History:  Diagnosis Date   Abnormal uterine bleeding    Asthma    as a child/situational now   Decreased fetal movement 01/25/2020   Dysmenorrhea    Hypothyroidism    Past Surgical History:  Procedure Laterality Date   BUNIONECTOMY  2002   left   skyla iud insertion     TONSILLECTOMY  1998    Family History  Problem Relation Age of Onset   Bipolar disorder Mother    Fibroids Mother        embedded fibroids and ovarian cyst   Thyroid disease Mother    Hypertension Mother        ?   Bipolar disorder Brother    Diabetes Maternal Grandmother        prediabetic   Endometriosis Maternal Grandmother    Diabetes Paternal Grandfather    Prostate  cancer Paternal Grandfather    Heart disease Paternal Grandfather    Breast cancer Other        maternal great grandmother   Social History   Socioeconomic History   Marital status: Married    Spouse name: Not on file   Number of children: Not on file   Years of education: Not on file   Highest education level: Not on file  Occupational History   Not on file  Tobacco Use   Smoking status: Never   Smokeless tobacco: Never  Substance and Sexual Activity   Alcohol use: Not Currently    Alcohol/week: 1.0 - 3.0 standard drink of alcohol    Types: 1 - 3 Standard drinks or equivalent per week   Drug use: No   Sexual activity: Yes    Partners: Male    Birth control/protection: None  Other Topics Concern   Not on file  Social History Narrative   Not on file   Social Determinants of Health   Financial Resource Strain: Not on file  Food Insecurity: Not on file  Transportation Needs: Not on file  Physical Activity: Not on file  Stress: Not on file  Social Connections: Not on file    Review of Systems  CONSTITUTIONAL:  Negative for chills, fatigue, fever, unintentional weight gain and unintentional weight loss.  CARDIOVASCULAR: Negative for chest pain, dizziness, palpitations and pedal edema.  RESPIRATORY: Negative for recent cough and dyspnea.   PSYCHIATRIC: Negative for sleep disturbance and to question depression screen.  Negative for depression, negative for anhedonia.      Objective:  PHYSICAL EXAM:   VS: BP 114/80 (BP Location: Left Arm, Patient Position: Sitting, Cuff Size: Large)   Pulse 81   Temp (!) 97.4 F (36.3 C) (Temporal)   Ht 5\' 9"  (1.753 m)   Wt 237 lb 3.2 oz (107.6 kg)   SpO2 99%   BMI 35.03 kg/m   GEN: Well nourished, well developed, in no acute distress   Cardiac: RRR; no murmurs, rubs, or gallops,no edema -  Respiratory:  normal respiratory rate and pattern with no distress - normal breath sounds with no rales, rhonchi, wheezes or  rubs  Psych: euthymic mood, appropriate affect and demeanor   Lab Results  Component Value Date   WBC 8.6 02/11/2021   HGB 13.9 02/11/2021   HCT 41.0 02/11/2021   PLT 273 02/11/2021   GLUCOSE 106 (H) 02/11/2021   ALT 23 02/11/2021   AST 23 02/11/2021   NA 139 02/11/2021   K 4.8 02/11/2021   CL 101 02/11/2021   CREATININE 0.82 02/11/2021   BUN 12 02/11/2021   CO2 22 02/11/2021   TSH 3.080 09/06/2021      Assessment & Plan:   Problem List Items Addressed This Visit       Endocrine   Hypothyroidism - Primary Continue current med     Other   Attention deficit disorder (ADD) without hyperactivity Continue current med  .  No orders of the defined types were placed in this encounter.   No orders of the defined types were placed in this encounter.    Follow-up: Return in about 6 months (around 10/31/2022) for follow up.  An After Visit Summary was printed and given to the patient.  11/02/2022 [facility] [phone]

## 2022-05-03 LAB — TSH: TSH: 2.9 u[IU]/mL (ref 0.450–4.500)

## 2022-05-22 ENCOUNTER — Encounter: Payer: Self-pay | Admitting: Physician Assistant

## 2022-05-22 ENCOUNTER — Other Ambulatory Visit: Payer: Self-pay | Admitting: Physician Assistant

## 2022-05-22 DIAGNOSIS — F988 Other specified behavioral and emotional disorders with onset usually occurring in childhood and adolescence: Secondary | ICD-10-CM

## 2022-05-22 MED ORDER — AMPHETAMINE-DEXTROAMPHETAMINE 20 MG PO TABS: 20.0000 mg | ORAL_TABLET | Freq: Two times a day (BID) | ORAL | 0 refills | Status: DC

## 2022-05-22 NOTE — Telephone Encounter (Signed)
With full thyroid panel normal and TSH normal would not recommend changing dose because that also puts her at risk of hyperthyroidism  Refill of Adderall sent

## 2022-05-23 NOTE — Telephone Encounter (Signed)
Patient stated that she will order the tsh panel and also wanted provider to know that she up her b12 and vitamin D as well to see if  that will help.

## 2022-07-06 ENCOUNTER — Other Ambulatory Visit: Payer: Self-pay | Admitting: Physician Assistant

## 2022-07-06 DIAGNOSIS — F988 Other specified behavioral and emotional disorders with onset usually occurring in childhood and adolescence: Secondary | ICD-10-CM

## 2022-07-07 MED ORDER — LEVOTHYROXINE SODIUM 75 MCG PO TABS: ORAL_TABLET | ORAL | 0 refills | Status: DC

## 2022-07-07 MED ORDER — AMPHETAMINE-DEXTROAMPHETAMINE 20 MG PO TABS: 20.0000 mg | ORAL_TABLET | Freq: Two times a day (BID) | ORAL | 0 refills | Status: DC

## 2022-07-17 LAB — HM PAP SMEAR: HM Pap smear: NEGATIVE

## 2022-08-18 ENCOUNTER — Other Ambulatory Visit: Payer: Self-pay | Admitting: Physician Assistant

## 2022-08-18 DIAGNOSIS — F988 Other specified behavioral and emotional disorders with onset usually occurring in childhood and adolescence: Secondary | ICD-10-CM

## 2022-08-20 MED ORDER — AMPHETAMINE-DEXTROAMPHETAMINE 20 MG PO TABS: 20.0000 mg | ORAL_TABLET | Freq: Two times a day (BID) | ORAL | 0 refills | Status: DC

## 2022-08-26 ENCOUNTER — Other Ambulatory Visit: Payer: Self-pay

## 2022-08-26 MED ORDER — LEVOTHYROXINE SODIUM 75 MCG PO TABS: ORAL_TABLET | ORAL | 0 refills | Status: DC

## 2022-10-10 ENCOUNTER — Other Ambulatory Visit: Payer: Self-pay | Admitting: Physician Assistant

## 2022-10-10 DIAGNOSIS — F988 Other specified behavioral and emotional disorders with onset usually occurring in childhood and adolescence: Secondary | ICD-10-CM

## 2022-10-10 MED ORDER — AMPHETAMINE-DEXTROAMPHETAMINE 20 MG PO TABS: 20.0000 mg | ORAL_TABLET | Freq: Two times a day (BID) | ORAL | 0 refills | Status: DC

## 2022-11-03 ENCOUNTER — Ambulatory Visit: Payer: Self-pay | Admitting: Physician Assistant

## 2022-11-03 ENCOUNTER — Encounter: Payer: Self-pay | Admitting: Physician Assistant

## 2022-11-03 VITALS — BP 116/78 | HR 84 | Temp 97.1°F | Ht 69.0 in | Wt 240.8 lb

## 2022-11-03 DIAGNOSIS — F419 Anxiety disorder, unspecified: Secondary | ICD-10-CM

## 2022-11-03 DIAGNOSIS — E039 Hypothyroidism, unspecified: Secondary | ICD-10-CM

## 2022-11-03 DIAGNOSIS — R5383 Other fatigue: Secondary | ICD-10-CM

## 2022-11-03 MED ORDER — ESCITALOPRAM OXALATE 10 MG PO TABS: 10.0000 mg | ORAL_TABLET | Freq: Every day | ORAL | 2 refills | Status: DC

## 2022-11-03 NOTE — Progress Notes (Signed)
Established Patient Office Visit  Subjective:  Patient ID: Felicia Burns, female    DOB: 1991-08-09  Age: 32 y.o. MRN: [id number]  CC:  Chief Complaint  Patient presents with   Hypothyroidism   ADD    HPI Felicia Burns presents for chronic follow up  Pt with history of hypothyroidism - currently on synthroid 19mcg qd -- she is having a mild amount of fatigue - due for labwork  Pt with history of ADD - stable on Adderall 20mg   Pt has been having GAD symptoms with mild depression.  Feeling nervous, overwhelmed and at times spiraling thoughts with no suicidal ideations.   She is currently seeing a counselor as well.  Think she would benefit from medication  Past Medical History:  Diagnosis Date   Abnormal uterine bleeding    Asthma    as a child/situational now   Decreased fetal movement 01/25/2020   Dysmenorrhea    Hypothyroidism     Past Surgical History:  Procedure Laterality Date   BUNIONECTOMY  2002   left   skyla iud insertion     TONSILLECTOMY  1998    Family History  Problem Relation Age of Onset   Bipolar disorder Mother    Fibroids Mother        embedded fibroids and ovarian cyst   Thyroid disease Mother    Hypertension Mother        ?   Bipolar disorder Brother    Diabetes Maternal Grandmother        prediabetic   Endometriosis Maternal Grandmother    Diabetes Paternal Grandfather    Prostate cancer Paternal Grandfather    Heart disease Paternal Grandfather    Breast cancer Other        maternal great grandmother    Social History   Socioeconomic History   Marital status: Married    Spouse name: Not on file   Number of children: Not on file   Years of education: Not on file   Highest education level: Not on file  Occupational History   Not on file  Tobacco Use   Smoking status: Never   Smokeless tobacco: Never  Substance and Sexual Activity   Alcohol use: Not Currently    Alcohol/week: 1.0 - 3.0 standard drink of alcohol     Types: 1 - 3 Standard drinks or equivalent per week   Drug use: No   Sexual activity: Yes    Partners: Male    Birth control/protection: None  Other Topics Concern   Not on file  Social History Narrative   Not on file   Social Determinants of Health   Financial Resource Strain: Not on file  Food Insecurity: Not on file  Transportation Needs: Not on file  Physical Activity: Not on file  Stress: Not on file  Social Connections: Not on file  Intimate Partner Violence: Not on file     Current Outpatient Medications:    amphetamine-dextroamphetamine (ADDERALL) 20 MG tablet, Take 1 tablet (20 mg total) by mouth 2 (two) times daily., Disp: 60 tablet, Rfl: 0   escitalopram (LEXAPRO) 10 MG tablet, Take 1 tablet (10 mg total) by mouth daily., Disp: 30 tablet, Rfl: 2   ibuprofen (ADVIL) 800 MG tablet, Take 1 tablet (800 mg total) by mouth every 8 (eight) hours as needed., Disp: 60 tablet, Rfl: 1   levothyroxine (SYNTHROID) 75 MCG tablet, 1 po qd, Disp: 90 tablet, Rfl: 0   Prenatal Vit-Fe Fumarate-FA (PRENATAL MULTIVITAMIN) TABS  tablet, Take 1 tablet by mouth daily at 12 noon., Disp: , Rfl:    Probiotic CHEW, See admin instructions., Disp: , Rfl:    Allergies  Allergen Reactions   Banana Swelling and Other (See Comments)   Azithromycin Other (See Comments)    Upset stomach   Erythromycin Other (See Comments)    Upset stmach Upset stmach    ROS CONSTITUTIONAL: has had some generalized fatigue CARDIOVASCULAR: Negative for chest pain,  RESPIRATORY: Negative for recent cough and dyspnea.  PSYCHIATRIC: see HPI       Objective:   PHYSICAL EXAM:   VS: BP 116/78 (BP Location: Left Arm, Patient Position: Sitting, Cuff Size: Large)   Pulse 84   Temp (!) 97.1 F (36.2 C) (Temporal)   Ht 5\' 9"  (1.753 m)   Wt 240 lb 12.8 oz (109.2 kg)   SpO2 97%   BMI 35.56 kg/m   GEN: Well nourished, well developed, in no acute distress  Cardiac: RRR; no murmurs, Respiratory:  normal  respiratory rate and pattern with no distress - normal breath sounds with no rales, rhonchi, wheezes or rubs Psych: euthymic mood, appropriate affect and demeanor     Health Maintenance Due  Topic Date Due   PAP SMEAR-Modifier  01/26/2016    There are no preventive care reminders to display for this patient.  Lab Results  Component Value Date   TSH 2.900 05/02/2022   Lab Results  Component Value Date   WBC 8.6 02/11/2021   HGB 13.9 02/11/2021   HCT 41.0 02/11/2021   MCV 92 02/11/2021   PLT 273 02/11/2021   Lab Results  Component Value Date   NA 139 02/11/2021   K 4.8 02/11/2021   CO2 22 02/11/2021   GLUCOSE 106 (H) 02/11/2021   BUN 12 02/11/2021   CREATININE 0.82 02/11/2021   BILITOT 0.2 02/11/2021   ALKPHOS 94 02/11/2021   AST 23 02/11/2021   ALT 23 02/11/2021   PROT 6.6 02/11/2021   ALBUMIN 4.3 02/11/2021   CALCIUM 9.2 02/11/2021   ANIONGAP 15 01/25/2020   EGFR 97 02/11/2021   No results found for: "CHOL" No results found for: "HDL" No results found for: "LDLCALC" No results found for: "TRIG" No results found for: "CHOLHDL" No results found for: "HGBA1C"    Assessment & Plan:   Problem List Items Addressed This Visit       Endocrine   Hypothyroidism - Primary   Relevant Orders   TSH Continue current med     Other   Other fatigue   Relevant Orders   TSH   CBC with Differential/Platelet   Comprehensive metabolic panel   Other Visit Diagnoses     Anxiety       Relevant Medications   escitalopram (LEXAPRO) 10 MG tablet       Meds ordered this encounter  Medications   escitalopram (LEXAPRO) 10 MG tablet    Sig: Take 1 tablet (10 mg total) by mouth daily.    Dispense:  30 tablet    Refill:  2    Order Specific Question:   Supervising Provider    Answer:   Shelton Silvas    Follow-up: Return in about 3 months (around 02/03/2023) for follow up. Call in 4 weeks and notify how medication is working   [employer], PA-C

## 2022-11-04 LAB — COMPREHENSIVE METABOLIC PANEL
ALT: 14 IU/L (ref 0–32)
AST: 15 IU/L (ref 0–40)
Albumin/Globulin Ratio: 1.7 (ref 1.2–2.2)
Albumin: 4.3 g/dL (ref 3.9–4.9)
Alkaline Phosphatase: 91 IU/L (ref 44–121)
BUN/Creatinine Ratio: 17 (ref 9–23)
BUN: 15 mg/dL (ref 6–20)
Bilirubin Total: 0.4 mg/dL (ref 0.0–1.2)
CO2: 22 mmol/L (ref 20–29)
Calcium: 9.3 mg/dL (ref 8.7–10.2)
Chloride: 102 mmol/L (ref 96–106)
Creatinine, Ser: 0.9 mg/dL (ref 0.57–1.00)
Globulin, Total: 2.5 g/dL (ref 1.5–4.5)
Glucose: 88 mg/dL (ref 70–99)
Potassium: 4.9 mmol/L (ref 3.5–5.2)
Sodium: 139 mmol/L (ref 134–144)
Total Protein: 6.8 g/dL (ref 6.0–8.5)
eGFR: 87 mL/min/{1.73_m2} (ref >59)

## 2022-11-04 LAB — CBC WITH DIFFERENTIAL/PLATELET
Basophils Absolute: 0 10*3/uL (ref 0.0–0.2)
Basos: 1 %
EOS (ABSOLUTE): 0.1 10*3/uL (ref 0.0–0.4)
Eos: 1 %
Hematocrit: 41.7 % (ref 34.0–46.6)
Hemoglobin: 14 g/dL (ref 11.1–15.9)
Immature Grans (Abs): 0 10*3/uL (ref 0.0–0.1)
Immature Granulocytes: 0 %
Lymphocytes Absolute: 1.8 10*3/uL (ref 0.7–3.1)
Lymphs: 25 %
MCH: 31.2 pg (ref 26.6–33.0)
MCHC: 33.6 g/dL (ref 31.5–35.7)
MCV: 93 fL (ref 79–97)
Monocytes Absolute: 0.6 10*3/uL (ref 0.1–0.9)
Monocytes: 8 %
Neutrophils Absolute: 4.7 10*3/uL (ref 1.4–7.0)
Neutrophils: 65 %
Platelets: 274 10*3/uL (ref 150–450)
RBC: 4.49 x10E6/uL (ref 3.77–5.28)
RDW: 12.2 % (ref 11.7–15.4)
WBC: 7.2 10*3/uL (ref 3.4–10.8)

## 2022-11-04 LAB — TSH: TSH: 3.42 u[IU]/mL (ref 0.450–4.500)

## 2022-11-20 ENCOUNTER — Other Ambulatory Visit: Payer: Self-pay | Admitting: Physician Assistant

## 2022-11-20 DIAGNOSIS — F988 Other specified behavioral and emotional disorders with onset usually occurring in childhood and adolescence: Secondary | ICD-10-CM

## 2022-11-20 MED ORDER — AMPHETAMINE-DEXTROAMPHETAMINE 20 MG PO TABS: 20.0000 mg | ORAL_TABLET | Freq: Two times a day (BID) | ORAL | 0 refills | Status: DC

## 2022-12-24 ENCOUNTER — Other Ambulatory Visit: Payer: Self-pay | Admitting: Physician Assistant

## 2022-12-24 DIAGNOSIS — F988 Other specified behavioral and emotional disorders with onset usually occurring in childhood and adolescence: Secondary | ICD-10-CM

## 2022-12-25 ENCOUNTER — Other Ambulatory Visit: Payer: Self-pay | Admitting: Physician Assistant

## 2022-12-25 DIAGNOSIS — F988 Other specified behavioral and emotional disorders with onset usually occurring in childhood and adolescence: Secondary | ICD-10-CM

## 2022-12-26 MED ORDER — AMPHETAMINE-DEXTROAMPHETAMINE 20 MG PO TABS
20.0000 mg | ORAL_TABLET | Freq: Two times a day (BID) | ORAL | 0 refills | Status: DC
Start: 2022-12-26 — End: 2023-01-27

## 2023-01-26 ENCOUNTER — Other Ambulatory Visit: Payer: Self-pay | Admitting: Physician Assistant

## 2023-01-26 ENCOUNTER — Other Ambulatory Visit: Payer: Self-pay

## 2023-01-26 ENCOUNTER — Encounter: Payer: Self-pay | Admitting: Physician Assistant

## 2023-01-26 DIAGNOSIS — F988 Other specified behavioral and emotional disorders with onset usually occurring in childhood and adolescence: Secondary | ICD-10-CM

## 2023-02-04 ENCOUNTER — Ambulatory Visit: Payer: Self-pay | Admitting: Physician Assistant

## 2023-02-27 ENCOUNTER — Other Ambulatory Visit: Payer: Self-pay | Admitting: Physician Assistant

## 2023-02-27 DIAGNOSIS — F988 Other specified behavioral and emotional disorders with onset usually occurring in childhood and adolescence: Secondary | ICD-10-CM

## 2023-03-19 ENCOUNTER — Other Ambulatory Visit: Payer: Self-pay | Admitting: Physician Assistant

## 2023-03-19 DIAGNOSIS — F988 Other specified behavioral and emotional disorders with onset usually occurring in childhood and adolescence: Secondary | ICD-10-CM

## 2023-04-07 ENCOUNTER — Ambulatory Visit: Payer: Self-pay | Admitting: Physician Assistant

## 2023-04-07 ENCOUNTER — Encounter: Payer: Self-pay | Admitting: Physician Assistant

## 2023-04-07 VITALS — BP 128/86 | HR 91 | Temp 96.8°F | Ht 69.0 in | Wt 245.4 lb

## 2023-04-07 DIAGNOSIS — F418 Other specified anxiety disorders: Secondary | ICD-10-CM

## 2023-04-07 MED ORDER — ESCITALOPRAM OXALATE 10 MG PO TABS
10.0000 mg | ORAL_TABLET | Freq: Every day | ORAL | 2 refills | Status: DC
Start: 2023-04-07 — End: 2023-06-11

## 2023-04-07 NOTE — Progress Notes (Signed)
Subjective:  Patient ID: Felicia Burns, female    DOB: Dec 30, 1990  Age: 32 y.o. MRN: [id number]  Chief Complaint  Patient presents with   Depression with anxiety    HPI Pt in for follow up - she has been having moderate anxiety and depressive symptoms - she was placed on lexapro 10mg  several months ago but only took about a week of medication and stopped it thinking she was having side effects from it - however even after stopping med she did not feel well.  She states today she is having moderate symptoms and they seem to worsen around time of her period She did say she had been on adderall for ADD and stopped that medication a few weeks ago and has more clarity since stopping the medication     04/07/2023    3:14 PM 11/03/2022   10:24 AM 05/02/2022   11:10 AM 12/11/2020    2:07 PM  Depression screen PHQ 2/9  Decreased Interest 2 0 0 0  Down, Depressed, Hopeless 1 0 0 0  PHQ - 2 Score 3 0 0 0  Altered sleeping 3  0   Tired, decreased energy 3  0   Change in appetite 1  0   Feeling bad or failure about yourself  3  0   Trouble concentrating 3  0   Moving slowly or fidgety/restless 0  0   Suicidal thoughts 1  0   PHQ-9 Score 17  0   Difficult doing work/chores Extremely dIfficult  Not difficult at all         01/27/2020    7:35 AM 01/27/2020    8:15 PM 01/28/2020    8:13 AM 11/03/2022   10:24 AM 04/07/2023    3:06 PM  Fall Risk  Falls in the past year?    0 0  Was there an injury with Fall?    0   Fall Risk Category Calculator    0   (RETIRED) Patient Fall Risk Level Low fall risk Low fall risk Low fall risk    Patient at Risk for Falls Due to    No Fall Risks   Fall risk Follow up    Falls evaluation completed Falls evaluation completed     ROS CONSTITUTIONAL: Negative for chills, fatigue, fever,   CARDIOVASCULAR: Negative for chest pain, dizziness,  RESPIRATORY: Negative for recent cough and dyspnea.  GASTROINTESTINAL: Negative for abdominal pain, acid reflux  symptoms, constipation, diarrhea, nausea and vomiting.   PSYCHIATRIC: see HPI   Current Outpatient Medications:    escitalopram (LEXAPRO) 10 MG tablet, Take 1 tablet (10 mg total) by mouth daily., Disp: 30 tablet, Rfl: 2   ibuprofen (ADVIL) 800 MG tablet, Take 1 tablet (800 mg total) by mouth every 8 (eight) hours as needed., Disp: 60 tablet, Rfl: 1   levothyroxine (SYNTHROID) 88 MCG tablet, Take 88 mcg by mouth every morning., Disp: , Rfl:    Prenatal Vit-Fe Fumarate-FA (PRENATAL MULTIVITAMIN) TABS tablet, Take 1 tablet by mouth daily at 12 noon., Disp: , Rfl:    Probiotic CHEW, See admin instructions., Disp: , Rfl:    amphetamine-dextroamphetamine (ADDERALL) 20 MG tablet, TAKE ONE TABLET BY MOUTH TWICE DAILY, Disp: 60 tablet, Rfl: 0  Past Medical History:  Diagnosis Date   Abnormal uterine bleeding    Asthma    as a child/situational now   Decreased fetal movement 01/25/2020   Dysmenorrhea    Hypothyroidism    Objective:  PHYSICAL EXAM:  BP 128/86 (BP Location: Left Arm, Patient Position: Sitting, Cuff Size: Large)   Pulse 91   Temp (!) 96.8 F (36 C)   Ht 5\' 9"  (1.753 m)   Wt 245 lb 6.4 oz (111.3 kg)   SpO2 98%   BMI 36.24 kg/m    GEN: Well nourished, well developed, in no acute distress  Cardiac: RRR; no murmurs,  Respiratory:  normal respiratory rate and pattern with no distress - normal breath sounds with no rales, rhonchi, wheezes or rubs  Psych: euthymic mood, appropriate affect and demeanor  Assessment & Plan:    Depression with anxiety -     Escitalopram Oxalate; Take 1 tablet (10 mg total) by mouth daily.  Dispense: 30 tablet; Refill: 2     Follow-up: Return in about 2 months (around 06/07/2023) for follow-up.  An After Visit Summary was printed and given to the patient.  Jettie Pagan [facility] [phone]

## 2023-04-30 ENCOUNTER — Ambulatory Visit: Payer: Self-pay | Admitting: Physician Assistant

## 2023-06-11 ENCOUNTER — Ambulatory Visit: Payer: Self-pay | Admitting: Physician Assistant

## 2023-06-11 ENCOUNTER — Encounter: Payer: Self-pay | Admitting: Physician Assistant

## 2023-06-11 VITALS — BP 110/80 | HR 89 | Temp 97.0°F | Ht 69.0 in | Wt 257.4 lb

## 2023-06-11 DIAGNOSIS — F418 Other specified anxiety disorders: Secondary | ICD-10-CM

## 2023-06-11 DIAGNOSIS — E039 Hypothyroidism, unspecified: Secondary | ICD-10-CM

## 2023-06-11 DIAGNOSIS — E559 Vitamin D deficiency, unspecified: Secondary | ICD-10-CM

## 2023-06-11 MED ORDER — BUPROPION HCL ER (XL) 150 MG PO TB24: 150.0000 mg | ORAL_TABLET | Freq: Every day | ORAL | 2 refills | Status: DC

## 2023-06-11 MED ORDER — VITAMIN D (ERGOCALCIFEROL) 1.25 MG (50000 UNIT) PO CAPS: 50000.0000 [IU] | ORAL_CAPSULE | ORAL | 5 refills | Status: DC

## 2023-06-11 MED ORDER — LEVOTHYROXINE SODIUM 88 MCG PO TABS: 88.0000 ug | ORAL_TABLET | Freq: Every morning | ORAL | 2 refills | Status: DC

## 2023-06-11 NOTE — Progress Notes (Signed)
Subjective:  Patient ID: Felicia Burns, female    DOB: 23-Mar-1991  Age: 32 y.o. MRN: [id number]  Chief Complaint  Patient presents with   Medical Management of Chronic Issues    HPI Pt with hypothyroidism - currently on synthroid qd - requests refill of medication Had labwork with [facility] in July and TSH normal  Pt with vit D def - she had labwork done in July that showed vit D at 14.  Would like to take rx supplement instead of daily dosing.  Will start that with plans to repeat labwork in 3 months  Pt presents again with depression, anxiety, feeling overwhelmed, spiraling thoughts and symptoms worse with menstrual cycles.  She is concerned about weight gain.  She has ADD symptoms as well and had tried adderall in past but that seemed to magnify anxiety  She has been prescribed lexapro and took it in past for about a week and stopped it and then at last visit prescribed again but she states her husband researched it and did not want her to take it so she never started it. Had long discussion about her multitude of symptoms and the need for medication to help her not struggle daily with anxiety and depression She is seeing counselor once weekly who also advised medication treatment     06/11/2023   11:33 AM 04/07/2023    3:14 PM 11/03/2022   10:24 AM 05/02/2022   11:10 AM 12/11/2020    2:07 PM  Depression screen PHQ 2/9  Decreased Interest 1 2 0 0 0  Down, Depressed, Hopeless 1 1 0 0 0  PHQ - 2 Score 2 3 0 0 0  Altered sleeping 1 3  0   Tired, decreased energy 1 3  0   Change in appetite 0 1  0   Feeling bad or failure about yourself  1 3  0   Trouble concentrating 3 3  0   Moving slowly or fidgety/restless 3 0  0   Suicidal thoughts 0 1  0   PHQ-9 Score 11 17  0   Difficult doing work/chores Somewhat difficult Extremely dIfficult  Not difficult at all         01/27/2020    8:15 PM 01/28/2020    8:13 AM 11/03/2022   10:24 AM 04/07/2023    3:06 PM 06/11/2023    11:33 AM  Fall Risk  Falls in the past year?   0 0 0  Was there an injury with Fall?   0  0  Fall Risk Category Calculator   0  0  (RETIRED) Patient Fall Risk Level Low fall risk Low fall risk     Patient at Risk for Falls Due to   No Fall Risks  No Fall Risks  Fall risk Follow up   Falls evaluation completed Falls evaluation completed Falls evaluation completed     ROS CONSTITUTIONAL: see HPI  CARDIOVASCULAR: Negative for chest pain, dizziness, palpitations and pedal edema.  RESPIRATORY: Negative for recent cough and dyspnea.   PSYCHIATRIC: see HPI   Current Outpatient Medications:    b complex vitamins capsule, Take 1 capsule by mouth daily., Disp: , Rfl:    Bacillus Coagulans-Inulin (PROBIOTIC-PREBIOTIC PO), Take by mouth daily., Disp: , Rfl:    Barberry-Oreg Grape-Goldenseal (BERBERINE COMPLEX PO), Take by mouth., Disp: , Rfl:    buPROPion (WELLBUTRIN XL) 150 MG 24 hr tablet, Take 1 tablet (150 mg total) by mouth daily., Disp: 30 tablet,  Rfl: 2   ibuprofen (ADVIL) 800 MG tablet, Take 1 tablet (800 mg total) by mouth every 8 (eight) hours as needed., Disp: 60 tablet, Rfl: 1   MAGNESIUM GLYCINATE PO, Take by mouth., Disp: , Rfl:    omega-3 acid ethyl esters (LOVAZA) 1 g capsule, Take 1 g by mouth daily., Disp: , Rfl:    Probiotic CHEW, See admin instructions., Disp: , Rfl:    VITAMIN D PO, Take by mouth., Disp: , Rfl:    Vitamin D, Ergocalciferol, (DRISDOL) 1.25 MG (50000 UNIT) CAPS capsule, Take 1 capsule (50,000 Units total) by mouth every 7 (seven) days., Disp: 5 capsule, Rfl: 5   levothyroxine (SYNTHROID) 88 MCG tablet, Take 1 tablet (88 mcg total) by mouth every morning., Disp: 30 tablet, Rfl: 2  Past Medical History:  Diagnosis Date   Abnormal uterine bleeding    Asthma    as a child/situational now   Decreased fetal movement 01/25/2020   Dysmenorrhea    Hypothyroidism    Objective:  PHYSICAL EXAM:   BP 110/80 (BP Location: Left Arm, Patient Position: Sitting, Cuff  Size: Large)   Pulse 89   Temp (!) 97 F (36.1 C) (Temporal)   Ht 5\' 9"  (1.753 m)   Wt 257 lb 6.4 oz (116.8 kg)   LMP 05/15/2023 (Approximate)   SpO2 99%   BMI 38.01 kg/m    GEN: Well nourished, well developed, in no acute distress   Cardiac: RRR; no murmurs,  Respiratory:  normal respiratory rate and pattern with no distress - normal breath sounds with no rales, rhonchi, wheezes or rubs  Psych: euthymic mood, appropriate affect and demeanor  Assessment & Plan:    Acquired hypothyroidism -     Levothyroxine Sodium; Take 1 tablet (88 mcg total) by mouth every morning.  Dispense: 30 tablet; Refill: 2  Depression with anxiety -     buPROPion HCl ER (XL); Take 1 tablet (150 mg total) by mouth daily.  Dispense: 30 tablet; Refill: 2 Continue weekly meetings with counselor Vitamin D insufficiency -     Vitamin D (Ergocalciferol); Take 1 capsule (50,000 Units total) by mouth every 7 (seven) days.  Dispense: 5 capsule; Refill: 5     Follow-up: Return in about 4 weeks (around 07/09/2023) for follow-up.  An After Visit Summary was printed and given to the patient.  Jettie Pagan [facility] [phone]

## 2023-07-09 ENCOUNTER — Ambulatory Visit: Payer: Self-pay | Admitting: Physician Assistant

## 2023-07-09 ENCOUNTER — Encounter: Payer: Self-pay | Admitting: Physician Assistant

## 2023-07-09 VITALS — BP 122/72 | HR 77 | Temp 97.6°F | Resp 16 | Ht 69.0 in | Wt 261.6 lb

## 2023-07-09 DIAGNOSIS — F418 Other specified anxiety disorders: Secondary | ICD-10-CM | POA: Insufficient documentation

## 2023-07-09 DIAGNOSIS — E559 Vitamin D deficiency, unspecified: Secondary | ICD-10-CM

## 2023-07-09 DIAGNOSIS — E039 Hypothyroidism, unspecified: Secondary | ICD-10-CM

## 2023-07-09 DIAGNOSIS — F988 Other specified behavioral and emotional disorders with onset usually occurring in childhood and adolescence: Secondary | ICD-10-CM

## 2023-07-09 MED ORDER — LISDEXAMFETAMINE DIMESYLATE 30 MG PO CAPS: 30.0000 mg | ORAL_CAPSULE | Freq: Every day | ORAL | 0 refills | Status: DC

## 2023-07-09 MED ORDER — BUPROPION HCL ER (XL) 300 MG PO TB24: 300.0000 mg | ORAL_TABLET | Freq: Every day | ORAL | 1 refills | Status: DC

## 2023-07-09 NOTE — Progress Notes (Signed)
Subjective:  Patient ID: Felicia Burns, female    DOB: 02-Oct-1990  Age: 32 y.o. MRN: [id number]  Chief Complaint  Patient presents with   Medical Management of Chronic Issues    HPI Pt with hypothyroidism - currently on synthroid qd -  Had labwork with [facility] in July and TSH normal  Pt with vit D def - she had labwork done in July that showed vit D at 14.  Has started supplements  Pt in for follow up of depression with anxiety - she states that she is taking wellbutrin 150mg  qd and is having no side effects and it has helped some but still having issues with feelings of lack of interest and motivation She is agreeable to try increased dose of medication and is still following online with therapist monthly She also states that she would like to try vyvanse now to help with her ADD symptoms and binge eating     07/09/2023   11:31 AM 06/11/2023   11:33 AM 04/07/2023    3:14 PM 11/03/2022   10:24 AM 05/02/2022   11:10 AM  Depression screen PHQ 2/9  Decreased Interest 3 1 2  0 0  Down, Depressed, Hopeless 3 1 1  0 0  PHQ - 2 Score 6 2 3  0 0  Altered sleeping 3 1 3   0  Tired, decreased energy 3 1 3   0  Change in appetite 3 0 1  0  Feeling bad or failure about yourself  3 1 3   0  Trouble concentrating 3 3 3   0  Moving slowly or fidgety/restless 2 3 0  0  Suicidal thoughts 1 0 1  0  PHQ-9 Score 24 11 17   0  Difficult doing work/chores Extremely dIfficult Somewhat difficult Extremely dIfficult  Not difficult at all        01/27/2020    8:15 PM 01/28/2020    8:13 AM 11/03/2022   10:24 AM 04/07/2023    3:06 PM 06/11/2023   11:33 AM  Fall Risk  Falls in the past year?   0 0 0  Was there an injury with Fall?   0  0  Fall Risk Category Calculator   0  0  (RETIRED) Patient Fall Risk Level Low fall risk Low fall risk     Patient at Risk for Falls Due to   No Fall Risks  No Fall Risks  Fall risk Follow up   Falls evaluation completed Falls evaluation completed Falls  evaluation completed    CONSTITUTIONAL: Negative for chills, fatigue, fever,   CARDIOVASCULAR: Negative for chest pain, dizziness, palpitations and pedal edema.  RESPIRATORY: Negative for recent cough and dyspnea.   PSYCHIATRIC: see HPI  Current Outpatient Medications:    b complex vitamins capsule, Take 1 capsule by mouth daily., Disp: , Rfl:    Bacillus Coagulans-Inulin (PROBIOTIC-PREBIOTIC PO), Take by mouth daily., Disp: , Rfl:    Barberry-Oreg Grape-Goldenseal (BERBERINE COMPLEX PO), Take by mouth., Disp: , Rfl:    buPROPion (WELLBUTRIN XL) 150 MG 24 hr tablet, Take 1 tablet (150 mg total) by mouth daily., Disp: 30 tablet, Rfl: 2   ibuprofen (ADVIL) 800 MG tablet, Take 1 tablet (800 mg total) by mouth every 8 (eight) hours as needed., Disp: 60 tablet, Rfl: 1   levothyroxine (SYNTHROID) 88 MCG tablet, Take 1 tablet (88 mcg total) by mouth every morning., Disp: 30 tablet, Rfl: 2   MAGNESIUM GLYCINATE PO, Take by mouth., Disp: , Rfl:  omega-3 acid ethyl esters (LOVAZA) 1 g capsule, Take 1 g by mouth daily., Disp: , Rfl:    Probiotic CHEW, See admin instructions., Disp: , Rfl:    Vitamin D, Ergocalciferol, (DRISDOL) 1.25 MG (50000 UNIT) CAPS capsule, Take 1 capsule (50,000 Units total) by mouth every 7 (seven) days., Disp: 5 capsule, Rfl: 5   VITAMIN D PO, Take by mouth. (Patient not taking: Reported on 07/09/2023), Disp: , Rfl:   Past Medical History:  Diagnosis Date   Abnormal uterine bleeding    Asthma    as a child/situational now   Decreased fetal movement 01/25/2020   Dysmenorrhea    Hypothyroidism    Objective:  PHYSICAL EXAM:   VS: BP 122/72 (BP Location: Right Arm, Patient Position: Sitting, Cuff Size: Large)   Pulse 77   Temp 97.6 F (36.4 C) (Temporal)   Resp 16   Ht 5\' 9"  (1.753 m)   Wt 261 lb 9.6 oz (118.7 kg)   LMP 06/17/2023 (Exact Date)   SpO2 97%   BMI 38.63 kg/m   GEN: Well nourished, well developed, in no acute distress  Cardiac: RRR; no murmurs,  rubs,  Respiratory:  normal respiratory rate and pattern with no distress - normal breath sounds with no rales, rhonchi, wheezes or rubs  Psych: euthymic mood, appropriate affect and demeanor   Assessment & Plan:    Acquired hypothyroidism Continue levothyroxine  Depression with anxiety Increase wellbutrin to 300mg  qd Continue montly meetings with counselor Vitamin D insufficiency Continue supplement ADD Rx for vyvanse 30mg  qd     Follow-up: Return in about 2 months (around 09/08/2023) for follow-up.  An After Visit Summary was printed and given to the patient.  Jettie Pagan [facility] [phone]

## 2023-07-10 ENCOUNTER — Other Ambulatory Visit: Payer: Self-pay | Admitting: Physician Assistant

## 2023-07-10 ENCOUNTER — Encounter: Payer: Self-pay | Admitting: Physician Assistant

## 2023-07-10 DIAGNOSIS — F988 Other specified behavioral and emotional disorders with onset usually occurring in childhood and adolescence: Secondary | ICD-10-CM

## 2023-07-10 MED ORDER — LISDEXAMFETAMINE DIMESYLATE 30 MG PO CAPS: 30.0000 mg | ORAL_CAPSULE | Freq: Every day | ORAL | 0 refills | Status: DC

## 2023-07-20 ENCOUNTER — Encounter: Payer: Self-pay | Admitting: Physician Assistant

## 2023-08-10 ENCOUNTER — Encounter: Payer: Self-pay | Admitting: Physician Assistant

## 2023-08-10 ENCOUNTER — Other Ambulatory Visit: Payer: Self-pay

## 2023-08-10 DIAGNOSIS — F988 Other specified behavioral and emotional disorders with onset usually occurring in childhood and adolescence: Secondary | ICD-10-CM

## 2023-08-10 MED ORDER — LISDEXAMFETAMINE DIMESYLATE 30 MG PO CAPS: 30.0000 mg | ORAL_CAPSULE | Freq: Every day | ORAL | 0 refills | Status: DC

## 2023-08-13 ENCOUNTER — Telehealth: Payer: Self-pay

## 2023-08-13 ENCOUNTER — Encounter: Payer: Self-pay | Admitting: Family Medicine

## 2023-08-13 ENCOUNTER — Other Ambulatory Visit: Payer: Self-pay | Admitting: Family Medicine

## 2023-08-13 MED ORDER — LISDEXAMFETAMINE DIMESYLATE 40 MG PO CAPS: 40.0000 mg | ORAL_CAPSULE | ORAL | 0 refills | Status: DC

## 2023-08-13 NOTE — Telephone Encounter (Signed)
Copied from CRM [id number]. Topic: Clinical - Medical Advice >> Aug 10, 2023 10:54 AM Geroge Baseman wrote: Reason for CRM: Patient called to give update on her new medications, a combination of webutrin and vyvance. Patient takes the medication at 11am but notices grumpy crash feeling. PMDD symptoms were less this month. Had a couple of days where tears came but not the darker stuff. Patient would like to continue with that regimen this month. She requests that the vyvance dosage be bumped up. She stated about 90 mins about after vyvance she feels very at peace. She states she's feeling a little more motivated and her wellbeing has improved this month. She want a refill on the vyvance but at a higher dose only if it will make it work longer cause it wears off too soon.

## 2023-08-13 NOTE — Telephone Encounter (Signed)
I left a message on the number(s) listed in the patients chart requesting the patient to call back regarding the upcomming appointment for 09/08/2023. The provider is out of the office that day. The appointment has been canceled. Waiting for the patient to return the call.  NOTE: If the patient does not call back within a week to reschedule this appointment, the front staff will mail the patient a letter requesting to call the office back.

## 2023-08-20 NOTE — Telephone Encounter (Signed)
 I have mailed the patient a letter requesting the patient to call the office to reschedule the appointment.

## 2023-08-24 ENCOUNTER — Encounter: Payer: Self-pay | Admitting: Physician Assistant

## 2023-09-08 ENCOUNTER — Ambulatory Visit: Payer: Self-pay | Admitting: Physician Assistant

## 2023-09-09 ENCOUNTER — Other Ambulatory Visit: Payer: Self-pay | Admitting: Physician Assistant

## 2023-09-09 DIAGNOSIS — F418 Other specified anxiety disorders: Secondary | ICD-10-CM

## 2023-09-09 DIAGNOSIS — E039 Hypothyroidism, unspecified: Secondary | ICD-10-CM

## 2023-09-15 ENCOUNTER — Encounter: Payer: Self-pay | Admitting: Physician Assistant

## 2023-09-15 ENCOUNTER — Ambulatory Visit: Payer: Self-pay | Admitting: Physician Assistant

## 2023-09-15 DIAGNOSIS — E039 Hypothyroidism, unspecified: Secondary | ICD-10-CM

## 2023-09-15 DIAGNOSIS — Z3201 Encounter for pregnancy test, result positive: Secondary | ICD-10-CM

## 2023-09-15 DIAGNOSIS — E559 Vitamin D deficiency, unspecified: Secondary | ICD-10-CM

## 2023-09-15 DIAGNOSIS — G930 Cerebral cysts: Secondary | ICD-10-CM | POA: Insufficient documentation

## 2023-09-15 DIAGNOSIS — N926 Irregular menstruation, unspecified: Secondary | ICD-10-CM

## 2023-09-15 NOTE — Progress Notes (Signed)
Subjective:  Patient ID: Felicia Burns, female    DOB: [birth year]  Age: 33 y.o. MRN: [id number]  Chief Complaint  Patient presents with   Medical Management of Chronic Issues    HPI Pt with hypothyroidism - currently on synthroid qd -  Is due for labwork  Pt with vit D def - she had labwork done in July that showed vit D at 14.  Has started supplements and would like level checked today  Pt in for follow up of depression with anxiety - she states that she is taking wellbutrin 300mg  qd and it is helping with her depressive symptoms  Pt states she was one week late with her period.  Last week took 2 home urine pregnancy tests which were positive but then 2 days later started her period and is still having bleeding today.  States bleeding was heavy over the weekend and lighter flow today.  Denies abdominal pain or cramping today     09/15/2023    2:14 PM 07/09/2023   11:31 AM 06/11/2023   11:33 AM 04/07/2023    3:14 PM 11/03/2022   10:24 AM  Depression screen PHQ 2/9  Decreased Interest 0 3 1 2  0  Down, Depressed, Hopeless 0 3 1 1  0  PHQ - 2 Score 0 6 2 3  0  Altered sleeping 0 3 1 3    Tired, decreased energy 0 3 1 3    Change in appetite 0 3 0 1   Feeling bad or failure about yourself  0 3 1 3    Trouble concentrating 0 3 3 3    Moving slowly or fidgety/restless 0 2 3 0   Suicidal thoughts 0 1 0 1   PHQ-9 Score 0 24 11 17    Difficult doing work/chores Not difficult at all Extremely dIfficult Somewhat difficult Extremely dIfficult         01/28/2020    8:13 AM 11/03/2022   10:24 AM 04/07/2023    3:06 PM 06/11/2023   11:33 AM 09/15/2023    2:14 PM  Fall Risk  Falls in the past year?  0 0 0 0  Was there an injury with Fall?  0  0 0  Fall Risk Category Calculator  0  0 0  (RETIRED) Patient Fall Risk Level Low fall risk      Patient at Risk for Falls Due to  No Fall Risks  No Fall Risks No Fall Risks  Fall risk Follow up  Falls evaluation completed Falls evaluation  completed Falls evaluation completed Falls evaluation completed  CONSTITUTIONAL: Negative for chills, fatigue, fever, unintentional weight gain and unintentional weight loss.   CARDIOVASCULAR: Negative for chest pain,  RESPIRATORY: Negative for recent cough and dyspnea.  GASTROINTESTINAL: Negative for abdominal pain, acid reflux symptoms, constipation, diarrhea, nausea and vomiting.  GU - see HPI PSYCHIATRIC: Negative for sleep disturbance and to question depression screen.  Negative for depression, negative for anhedonia.       Current Outpatient Medications:    b complex vitamins capsule, Take 1 capsule by mouth daily., Disp: , Rfl:    Bacillus Coagulans-Inulin (PROBIOTIC-PREBIOTIC PO), Take by mouth daily., Disp: , Rfl:    Barberry-Oreg Grape-Goldenseal (BERBERINE COMPLEX PO), Take by mouth., Disp: , Rfl:    buPROPion (WELLBUTRIN XL) 300 MG 24 hr tablet, Take 1 tablet by mouth once daily., Disp: 30 tablet, Rfl: 1   ibuprofen (ADVIL) 800 MG tablet, Take 1 tablet (800 mg total) by mouth every 8 (eight) hours as  needed., Disp: 60 tablet, Rfl: 1   levothyroxine (SYNTHROID) 88 MCG tablet, Take 1 tablet (88 mcg total) by mouth every morning., Disp: 30 tablet, Rfl: 2   lisdexamfetamine (VYVANSE) 40 MG capsule, Take 1 capsule (40 mg total) by mouth every morning., Disp: 30 capsule, Rfl: 0   MAGNESIUM GLYCINATE PO, Take by mouth., Disp: , Rfl:    omega-3 acid ethyl esters (LOVAZA) 1 g capsule, Take 1 g by mouth daily., Disp: , Rfl:    Probiotic CHEW, See admin instructions., Disp: , Rfl:    VITAMIN D PO, Take by mouth., Disp: , Rfl:   Past Medical History:  Diagnosis Date   Abnormal uterine bleeding    Asthma    as a child/situational now   Decreased fetal movement 01/25/2020   Dysmenorrhea    Hypothyroidism    Objective:  PHYSICAL EXAM:   VS: BP 114/80 (BP Location: Left Arm, Patient Position: Sitting, Cuff Size: Large)   Pulse 81   Temp (!) 97.1 F (36.2 C) (Temporal)   Resp 18    Ht 5\' 9"  (1.753 m)   Wt 266 lb 6.4 oz (120.8 kg)   LMP 09/08/2023 (Exact Date)   SpO2 99%   BMI 39.34 kg/m   GEN: Well nourished, well developed, in no acute distress   Cardiac: RRR; no murmurs,  Respiratory:  normal respiratory rate and pattern with no distress - normal breath sounds with no rales, rhonchi, wheezes or rubs  Skin: warm and dry, no rash   Psych: euthymic mood, appropriate affect and demeanor  Assessment & Plan:    Acquired hypothyroidism Continue levothyroxine TSH pending Depression with anxiety Continue meds Continue monthly meetings with counselor Vitamin D insufficiency Continue supplement Vit D level pending Abnormal menses with positive urine pregnancy Quant HCG pending    Follow-up: Return in about 3 months (around 12/14/2023) for follow-up.  An After Visit Summary was printed and given to the patient.  Jettie Pagan [facility] [phone]

## 2023-09-16 ENCOUNTER — Encounter: Payer: Self-pay | Admitting: Physician Assistant

## 2023-09-16 LAB — TSH: TSH: 4 u[IU]/mL (ref 0.450–4.500)

## 2023-09-16 LAB — VITAMIN D 25 HYDROXY (VIT D DEFICIENCY, FRACTURES): Vit D, 25-Hydroxy: 26 ng/mL — ABNORMAL LOW (ref 30.0–100.0)

## 2023-09-16 LAB — BETA HCG QUANT (REF LAB): hCG Quant: <1 m[IU]/mL

## 2023-09-26 ENCOUNTER — Other Ambulatory Visit: Payer: Self-pay | Admitting: Family Medicine

## 2023-09-28 MED ORDER — LISDEXAMFETAMINE DIMESYLATE 40 MG PO CAPS: 40.0000 mg | ORAL_CAPSULE | ORAL | 0 refills | Status: DC

## 2023-10-27 ENCOUNTER — Other Ambulatory Visit: Payer: Self-pay | Admitting: Physician Assistant

## 2023-10-27 MED ORDER — LISDEXAMFETAMINE DIMESYLATE 40 MG PO CAPS: 40.0000 mg | ORAL_CAPSULE | ORAL | 0 refills | Status: DC

## 2023-11-09 ENCOUNTER — Other Ambulatory Visit: Payer: Self-pay | Admitting: Physician Assistant

## 2023-11-09 DIAGNOSIS — F418 Other specified anxiety disorders: Secondary | ICD-10-CM

## 2023-11-16 ENCOUNTER — Telehealth: Payer: Self-pay

## 2023-11-16 NOTE — Telephone Encounter (Signed)
 I left a message on the number(s) listed in the patients chart requesting the patient to call back regarding the upcomming appointment for 12/15/2023. The provider is out of the office that day. The appointment has been canceled. Waiting for the patient to return the call.

## 2023-12-06 ENCOUNTER — Other Ambulatory Visit: Payer: Self-pay | Admitting: Physician Assistant

## 2023-12-07 MED ORDER — LISDEXAMFETAMINE DIMESYLATE 40 MG PO CAPS
40.0000 mg | ORAL_CAPSULE | ORAL | 0 refills | Status: DC
Start: 2023-12-07 — End: 2023-12-18

## 2023-12-11 ENCOUNTER — Other Ambulatory Visit: Payer: Self-pay | Admitting: Physician Assistant

## 2023-12-11 DIAGNOSIS — E039 Hypothyroidism, unspecified: Secondary | ICD-10-CM

## 2023-12-15 ENCOUNTER — Ambulatory Visit: Payer: Self-pay | Admitting: Physician Assistant

## 2023-12-18 ENCOUNTER — Encounter: Payer: Self-pay | Admitting: Physician Assistant

## 2023-12-18 ENCOUNTER — Other Ambulatory Visit (HOSPITAL_BASED_OUTPATIENT_CLINIC_OR_DEPARTMENT_OTHER): Payer: Self-pay

## 2023-12-18 ENCOUNTER — Ambulatory Visit: Payer: Self-pay | Admitting: Physician Assistant

## 2023-12-18 VITALS — BP 110/80 | HR 84 | Temp 98.4°F | Resp 18 | Ht 69.0 in | Wt 268.4 lb

## 2023-12-18 DIAGNOSIS — R5383 Other fatigue: Secondary | ICD-10-CM

## 2023-12-18 DIAGNOSIS — E039 Hypothyroidism, unspecified: Secondary | ICD-10-CM

## 2023-12-18 DIAGNOSIS — E559 Vitamin D deficiency, unspecified: Secondary | ICD-10-CM

## 2023-12-18 DIAGNOSIS — F988 Other specified behavioral and emotional disorders with onset usually occurring in childhood and adolescence: Secondary | ICD-10-CM

## 2023-12-18 DIAGNOSIS — F418 Other specified anxiety disorders: Secondary | ICD-10-CM

## 2023-12-18 MED ORDER — LISDEXAMFETAMINE DIMESYLATE 50 MG PO CAPS: 50.0000 mg | ORAL_CAPSULE | Freq: Every day | ORAL | 0 refills | Status: DC

## 2023-12-18 NOTE — Progress Notes (Signed)
 Subjective:  Patient ID: Felicia Burns, female    DOB: 1990-10-01  Age: 33 y.o. MRN: [id number]  Chief Complaint  Patient presents with   Medical Management of Chronic Issues    HPI Pt in today with continued fatigue - she is on synthroid  for hypothyroidism and last TSH in 1/25 was normal She had been told by endocrinology in past possible T3 supplement would help with fatigue - would advise to repeat thyroid  panel  Pt with ADD and has had binge eating - she is currently on vyvanse  40mg  qd - says she feels this medication is wearing off as far as ADD and would benefit from increased dose She is doing cross fit and watching diet and still noting weight gain  Pt with depression/anxiety- is stable on wellbutrin  XL 300mg      12/18/2023   10:16 AM 09/15/2023    2:14 PM 07/09/2023   11:31 AM 06/11/2023   11:33 AM 04/07/2023    3:14 PM  Depression screen PHQ 2/9  Decreased Interest 0 0 3 1 2   Down, Depressed, Hopeless 0 0 3 1 1   PHQ - 2 Score 0 0 6 2 3   Altered sleeping 0 0 3 1 3   Tired, decreased energy 0 0 3 1 3   Change in appetite 0 0 3 0 1  Feeling bad or failure about yourself  0 0 3 1 3   Trouble concentrating 3 0 3 3 3   Moving slowly or fidgety/restless 0 0 2 3 0  Suicidal thoughts 0 0 1 0 1  PHQ-9 Score 3 0 24 11 17   Difficult doing work/chores Not difficult at all Not difficult at all Extremely dIfficult Somewhat difficult Extremely dIfficult        11/03/2022   10:24 AM 04/07/2023    3:06 PM 06/11/2023   11:33 AM 09/15/2023    2:14 PM 12/18/2023   10:16 AM  Fall Risk  Falls in the past year? 0 0 0 0 0  Was there an injury with Fall? 0  0 0 0  Fall Risk Category Calculator 0  0 0 0  Patient at Risk for Falls Due to No Fall Risks  No Fall Risks No Fall Risks No Fall Risks  Fall risk Follow up Falls evaluation completed Falls evaluation completed Falls evaluation completed Falls evaluation completed Falls evaluation completed    CONSTITUTIONAL: Negative for chills,  fatigue, fever,   CARDIOVASCULAR: Negative for chest pain, dizziness, palpitations RESPIRATORY: Negative for recent cough and dyspnea.   PSYCHIATRIC: Negative for sleep disturbance and to question depression screen.  Negative for depression, negative for anhedonia.       Current Outpatient Medications:    b complex vitamins capsule, Take 1 capsule by mouth daily., Disp: , Rfl:    Bacillus Coagulans-Inulin (PROBIOTIC-PREBIOTIC PO), Take by mouth daily., Disp: , Rfl:    Barberry-Oreg Grape-Goldenseal (BERBERINE COMPLEX PO), Take by mouth., Disp: , Rfl:    buPROPion  (WELLBUTRIN  XL) 300 MG 24 hr tablet, Take 1 tablet by mouth once daily., Disp: 30 tablet, Rfl: 1   ibuprofen  (ADVIL ) 800 MG tablet, Take 1 tablet (800 mg total) by mouth every 8 (eight) hours as needed., Disp: 60 tablet, Rfl: 1   levothyroxine  (SYNTHROID ) 88 MCG tablet, Take 1 tablet (88 mcg total) by mouth every morning., Disp: 30 tablet, Rfl: 0   lisdexamfetamine (VYVANSE ) 50 MG capsule, Take 1 capsule (50 mg total) by mouth daily., Disp: 30 capsule, Rfl: 0   MAGNESIUM GLYCINATE PO, Take  by mouth., Disp: , Rfl:    omega-3 acid ethyl esters (LOVAZA) 1 g capsule, Take 1 g by mouth daily., Disp: , Rfl:    VITAMIN D  PO, Take by mouth., Disp: , Rfl:   Past Medical History:  Diagnosis Date   Abnormal uterine bleeding    Asthma    as a child/situational now   Decreased fetal movement 01/25/2020   Dysmenorrhea    Hypothyroidism    Objective:  PHYSICAL EXAM:   VS: BP 110/80   Pulse 84   Temp 98.4 F (36.9 C) (Temporal)   Resp 18   Ht 5\' 9"  (1.753 m)   Wt 268 lb 6.4 oz (121.7 kg)   SpO2 98%   BMI 39.64 kg/m   GEN: Well nourished, well developed, in no acute distress  Cardiac: RRR; no murmurs,  Respiratory:  normal respiratory rate and pattern with no distress - normal breath sounds with no rales, rhonchi, wheezes or rubs GI: normal bowel sounds, no masses or tenderness  Psych: euthymic mood, appropriate affect and  demeanor  Assessment & Plan:    Acquired hypothyroidism -     Thyroid  Panel With TSH  Vitamin D  insufficiency -     VITAMIN D  25 Hydroxy (Vit-D Deficiency, Fractures)  Attention deficit disorder (ADD) without hyperactivity -     Lisdexamfetamine Dimesylate ; Take 1 capsule (50 mg total) by mouth daily.  Dispense: 30 capsule; Refill: 0  Depression with anxiety  Other fatigue -     CBC with Differential/Platelet -     Comprehensive metabolic panel with GFR -     Thyroid  Panel With TSH     Follow-up: Return in about 3 months (around 03/19/2024) for follow-up.  An After Visit Summary was printed and given to the patient.  Anthonette Bastos [facility] [phone]

## 2023-12-19 LAB — COMPREHENSIVE METABOLIC PANEL WITH GFR
ALT: 35 IU/L — ABNORMAL HIGH (ref 0–32)
AST: 23 IU/L (ref 0–40)
Albumin: 4.5 g/dL (ref 3.9–4.9)
Alkaline Phosphatase: 98 IU/L (ref 44–121)
BUN/Creatinine Ratio: 12 (ref 9–23)
BUN: 11 mg/dL (ref 6–20)
Bilirubin Total: 0.3 mg/dL (ref 0.0–1.2)
CO2: 21 mmol/L (ref 20–29)
Calcium: 9.4 mg/dL (ref 8.7–10.2)
Chloride: 102 mmol/L (ref 96–106)
Creatinine, Ser: 0.91 mg/dL (ref 0.57–1.00)
Globulin, Total: 2.6 g/dL (ref 1.5–4.5)
Glucose: 86 mg/dL (ref 70–99)
Potassium: 4.8 mmol/L (ref 3.5–5.2)
Sodium: 138 mmol/L (ref 134–144)
Total Protein: 7.1 g/dL (ref 6.0–8.5)
eGFR: 84 mL/min/{1.73_m2} (ref >59)

## 2023-12-19 LAB — THYROID PANEL WITH TSH
Free Thyroxine Index: 2.1 (ref 1.2–4.9)
T3 Uptake Ratio: 26 % (ref 24–39)
T4, Total: 8.2 ug/dL (ref 4.5–12.0)
TSH: 3.83 u[IU]/mL (ref 0.450–4.500)

## 2023-12-19 LAB — CBC WITH DIFFERENTIAL/PLATELET
Basophils Absolute: 0.1 10*3/uL (ref 0.0–0.2)
Basos: 1 %
EOS (ABSOLUTE): 0.3 10*3/uL (ref 0.0–0.4)
Eos: 3 %
Hematocrit: 43.3 % (ref 34.0–46.6)
Hemoglobin: 14.5 g/dL (ref 11.1–15.9)
Immature Grans (Abs): 0 10*3/uL (ref 0.0–0.1)
Immature Granulocytes: 1 %
Lymphocytes Absolute: 2.3 10*3/uL (ref 0.7–3.1)
Lymphs: 28 %
MCH: 31.4 pg (ref 26.6–33.0)
MCHC: 33.5 g/dL (ref 31.5–35.7)
MCV: 94 fL (ref 79–97)
Monocytes Absolute: 0.7 10*3/uL (ref 0.1–0.9)
Monocytes: 8 %
Neutrophils Absolute: 4.8 10*3/uL (ref 1.4–7.0)
Neutrophils: 59 %
Platelets: 276 10*3/uL (ref 150–450)
RBC: 4.62 x10E6/uL (ref 3.77–5.28)
RDW: 12.2 % (ref 11.7–15.4)
WBC: 8.1 10*3/uL (ref 3.4–10.8)

## 2023-12-19 LAB — VITAMIN D 25 HYDROXY (VIT D DEFICIENCY, FRACTURES): Vit D, 25-Hydroxy: 39.8 ng/mL (ref 30.0–100.0)

## 2023-12-20 ENCOUNTER — Encounter: Payer: Self-pay | Admitting: Physician Assistant

## 2024-01-12 ENCOUNTER — Other Ambulatory Visit: Payer: Self-pay | Admitting: Physician Assistant

## 2024-01-12 DIAGNOSIS — E039 Hypothyroidism, unspecified: Secondary | ICD-10-CM

## 2024-01-18 ENCOUNTER — Encounter: Payer: Self-pay | Admitting: Physician Assistant

## 2024-01-18 ENCOUNTER — Other Ambulatory Visit: Payer: Self-pay | Admitting: Physician Assistant

## 2024-01-18 DIAGNOSIS — F988 Other specified behavioral and emotional disorders with onset usually occurring in childhood and adolescence: Secondary | ICD-10-CM

## 2024-01-18 MED ORDER — LISDEXAMFETAMINE DIMESYLATE 50 MG PO CAPS: 50.0000 mg | ORAL_CAPSULE | Freq: Every day | ORAL | 0 refills | Status: DC

## 2024-01-25 ENCOUNTER — Encounter: Payer: Self-pay | Admitting: Physician Assistant

## 2024-01-27 ENCOUNTER — Other Ambulatory Visit: Payer: Self-pay | Admitting: Physician Assistant

## 2024-01-27 DIAGNOSIS — F418 Other specified anxiety disorders: Secondary | ICD-10-CM

## 2024-02-18 ENCOUNTER — Other Ambulatory Visit: Payer: Self-pay | Admitting: Family Medicine

## 2024-02-18 ENCOUNTER — Other Ambulatory Visit: Payer: Self-pay | Admitting: Physician Assistant

## 2024-02-18 DIAGNOSIS — F988 Other specified behavioral and emotional disorders with onset usually occurring in childhood and adolescence: Secondary | ICD-10-CM

## 2024-02-18 MED ORDER — LISDEXAMFETAMINE DIMESYLATE 50 MG PO CAPS: 50.0000 mg | ORAL_CAPSULE | Freq: Every day | ORAL | 0 refills | Status: DC

## 2024-03-13 ENCOUNTER — Encounter: Payer: Self-pay | Admitting: Physician Assistant

## 2024-03-24 ENCOUNTER — Ambulatory Visit: Payer: Self-pay | Admitting: Physician Assistant

## 2024-03-30 ENCOUNTER — Ambulatory Visit: Payer: Self-pay | Admitting: Physician Assistant

## 2024-03-31 ENCOUNTER — Ambulatory Visit: Payer: Self-pay | Admitting: Physician Assistant

## 2024-03-31 ENCOUNTER — Encounter: Payer: Self-pay | Admitting: Physician Assistant

## 2024-03-31 ENCOUNTER — Ambulatory Visit (INDEPENDENT_AMBULATORY_CARE_PROVIDER_SITE_OTHER): Payer: Self-pay | Admitting: Physician Assistant

## 2024-03-31 VITALS — BP 118/73 | HR 82 | Temp 97.9°F | Resp 16 | Ht 69.0 in | Wt 266.0 lb

## 2024-03-31 DIAGNOSIS — E039 Hypothyroidism, unspecified: Secondary | ICD-10-CM

## 2024-03-31 DIAGNOSIS — F988 Other specified behavioral and emotional disorders with onset usually occurring in childhood and adolescence: Secondary | ICD-10-CM

## 2024-03-31 DIAGNOSIS — F418 Other specified anxiety disorders: Secondary | ICD-10-CM

## 2024-03-31 MED ORDER — BUPROPION HCL ER (XL) 150 MG PO TB24: 150.0000 mg | ORAL_TABLET | Freq: Every day | ORAL | 1 refills | Status: DC

## 2024-03-31 MED ORDER — LISDEXAMFETAMINE DIMESYLATE 50 MG PO CAPS: 50.0000 mg | ORAL_CAPSULE | Freq: Every day | ORAL | 0 refills | Status: DC

## 2024-03-31 MED ORDER — LEVOTHYROXINE SODIUM 88 MCG PO TABS: 88.0000 ug | ORAL_TABLET | Freq: Every morning | ORAL | 1 refills | Status: AC

## 2024-03-31 NOTE — Progress Notes (Signed)
 Subjective:  Patient ID: Felicia Burns, female    DOB: 1991-01-03  Age: 33 y.o. MRN: [id number]  Chief Complaint  Patient presents with   Medical Management of Chronic Issues    76M    HPI Pt with hypothyroidism - stable on synthroid 88mcg - last labwork was normal - requests refill of medication  Pt with ADD and has had binge eating - she is currently on vyvanse 50mg  qd - she has been off the medication for several days because is having a hard time having it filled at [facility] - will give written rx to try other pharmacies  Pt with depression/anxiety- had been doing very well on wellbutrin XL 300mg  but had stopped it about 2 weeks ago - she would like to restart medication but at a lower dose because she feels she would still benefit from the medication     03/31/2024    3:14 PM 12/18/2023   10:16 AM 09/15/2023    2:14 PM 07/09/2023   11:31 AM 06/11/2023   11:33 AM  Depression screen PHQ 2/9  Decreased Interest 0 0 0 3 1  Down, Depressed, Hopeless 0 0 0 3 1  PHQ - 2 Score 0 0 0 6 2  Altered sleeping 1 0 0 3 1  Tired, decreased energy 3 0 0 3 1  Change in appetite 1 0 0 3 0  Feeling bad or failure about yourself  1 0 0 3 1  Trouble concentrating 1 3 0 3 3  Moving slowly or fidgety/restless 1 0 0 2 3  Suicidal thoughts 0 0 0 1 0  PHQ-9 Score 8 3 0 24 11  Difficult doing work/chores Somewhat difficult Not difficult at all Not difficult at all Extremely dIfficult Somewhat difficult        11/03/2022   10:24 AM 04/07/2023    3:06 PM 06/11/2023   11:33 AM 09/15/2023    2:14 PM 12/18/2023   10:16 AM  Fall Risk  Falls in the past year? 0 0 0 0 0  Was there an injury with Fall? 0  0 0 0  Fall Risk Category Calculator 0  0 0 0  Patient at Risk for Falls Due to No Fall Risks  No Fall Risks No Fall Risks No Fall Risks  Fall risk Follow up Falls evaluation completed Falls evaluation completed Falls evaluation completed Falls evaluation completed Falls evaluation completed     CONSTITUTIONAL: Negative for chills, fatigue, fever, unintentional weight gain and unintentional weight loss.   CARDIOVASCULAR: Negative for chest pain, dizziness, palpitations and pedal edema.  RESPIRATORY: Negative for recent cough and dyspnea.  GASTROINTESTINAL: Negative for abdominal pain, acid reflux symptoms, constipation, diarrhea, nausea and vomiting.   PSYCHIATRIC: see HPI       Current Outpatient Medications:    b complex vitamins capsule, Take 1 capsule by mouth daily., Disp: , Rfl:    Bacillus Coagulans-Inulin (PROBIOTIC-PREBIOTIC PO), Take by mouth daily., Disp: , Rfl:    Barberry-Oreg Grape-Goldenseal (BERBERINE COMPLEX PO), Take by mouth., Disp: , Rfl:    buPROPion (WELLBUTRIN XL) 150 MG 24 hr tablet, Take 1 tablet (150 mg total) by mouth daily., Disp: 90 tablet, Rfl: 1   ibuprofen (ADVIL) 800 MG tablet, Take 1 tablet (800 mg total) by mouth every 8 (eight) hours as needed., Disp: 60 tablet, Rfl: 1   MAGNESIUM GLYCINATE PO, Take by mouth., Disp: , Rfl:    omega-3 acid ethyl esters (LOVAZA) 1 g capsule, Take 1 g  by mouth daily., Disp: , Rfl:    VITAMIN D PO, Take by mouth., Disp: , Rfl:    levothyroxine (SYNTHROID) 88 MCG tablet, Take 1 tablet (88 mcg total) by mouth every morning., Disp: 90 tablet, Rfl: 1   lisdexamfetamine (VYVANSE) 50 MG capsule, Take 1 capsule (50 mg total) by mouth daily., Disp: 30 capsule, Rfl: 0  Past Medical History:  Diagnosis Date   Abnormal uterine bleeding    Asthma    as a child/situational now   Decreased fetal movement 01/25/2020   Dysmenorrhea    Hypothyroidism    Objective:  PHYSICAL EXAM:   VS: BP 118/73   Pulse 82   Temp 97.9 F (36.6 C)   Resp 16   Ht 5' 9 (1.753 m)   Wt 266 lb (120.7 kg)   SpO2 98%   BMI 39.28 kg/m   GEN: Well nourished, well developed, in no acute distress  Cardiac: RRR; no murmurs,  Respiratory:  normal respiratory rate and pattern with no distress - normal breath sounds with no rales, rhonchi,  wheezes or rubs  Skin: warm and dry, no rash  Neuro:  Alert and Oriented x 3,  - CN II-Xii grossly intact Psych: euthymic mood, appropriate affect and demeanor   Assessment & Plan:    Depression with anxiety -     buPROPion HCl ER (XL); Take 1 tablet (150 mg total) by mouth daily.  Dispense: 90 tablet; Refill: 1  Acquired hypothyroidism -     Levothyroxine Sodium; Take 1 tablet (88 mcg total) by mouth every morning.  Dispense: 90 tablet; Refill: 1  Attention deficit disorder (ADD) without hyperactivity -     Lisdexamfetamine Dimesylate; Take 1 capsule (50 mg total) by mouth daily.  Dispense: 30 capsule; Refill: 0      Follow-up: Return in about 4 months (around 07/31/2024) for chronic fasting follow-up.  An After Visit Summary was printed and given to the patient.  CAMIE JONELLE NICHOLAUS DEVONNA [facility] [phone]

## 2024-08-05 ENCOUNTER — Encounter: Payer: Self-pay | Admitting: Physician Assistant

## 2024-08-23 ENCOUNTER — Encounter: Payer: Self-pay | Admitting: Physician Assistant

## 2024-08-25 ENCOUNTER — Encounter (HOSPITAL_BASED_OUTPATIENT_CLINIC_OR_DEPARTMENT_OTHER): Payer: Self-pay

## 2024-08-25 ENCOUNTER — Ambulatory Visit (HOSPITAL_BASED_OUTPATIENT_CLINIC_OR_DEPARTMENT_OTHER)
Admission: EM | Admit: 2024-08-25 | Discharge: 2024-08-25 | Disposition: A | Discharge status: Home / Self Care | Payer: Self-pay

## 2024-08-25 DIAGNOSIS — M795 Residual foreign body in soft tissue: Secondary | ICD-10-CM

## 2024-08-25 NOTE — Discharge Instructions (Signed)
 We removed the foreign body from your foot.  Keep the area clean and use antibiotic ointment and a dressing for the next couple days until the area is healing.  Watch for signs of infection to include increased swelling, pain or warm at the site.  Follow-up as needed

## 2024-08-25 NOTE — ED Notes (Addendum)
 Present in room when approx 1.5inch wood splinter pulled from ball of foot. Patient tolerated well. Splinter intact with bleeding following it's removal. Dressing of 4x4 and coban applied.

## 2024-08-25 NOTE — ED Triage Notes (Signed)
 Pt states she was walking in her house with fussy socks when they got caught on a rough edge of the hard wood floors. A piece of the floor jammed into her left foot. She attempted to pull out the wood but was worried it was more splintered and she wouldn't be able to get it all out. She has not taken anything since the injury.

## 2024-08-25 NOTE — ED Provider Notes (Signed)
 " [facility]    CSN: [id number] Arrival date & time: 08/25/24  1739      History   Chief Complaint Chief Complaint  Patient presents with   Foreign Body in Skin    HPI Felicia Burns is a 34 y.o. female.   Pt states she was walking in her house with fussy socks when they got caught on a rough edge of the hard wood floors. A piece of the floor jammed into her left foot. She attempted to pull out the wood but was worried it was more splintered and she wouldn't be able to get it all out. She has not taken anything since the injury.       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   Depression with anxiety 07/09/2023   Vitamin D  insufficiency 07/09/2023   Other fatigue 02/11/2021   Hyperprolactinemia 12/11/2020   Hypothyroidism 12/11/2020   Attention deficit disorder (ADD) without hyperactivity 12/11/2020   SVD (spontaneous vaginal delivery) 01/26/2020   Decreased fetal movement 01/25/2020   Pregnant 01/25/2020   Morbid obesity (HCC) 06/27/2014    Class: History of   DUB (dysfunctional uterine bleeding) 05/08/2013    Past Surgical History:  Procedure Laterality Date   BUNIONECTOMY  2002   left   skyla iud insertion     TONSILLECTOMY  1998    OB History     Gravida  1   Para  1   Term  1   Preterm  0   AB  0   Living  1      SAB  0   IAB  0   Ectopic  0   Multiple  0   Live Births  1            Home Medications    Prior to Admission medications  Medication Sig Start Date End Date Taking? Authorizing Provider  b complex vitamins capsule Take 1 capsule by mouth daily.    [provider]  Bacillus Coagulans-Inulin (PROBIOTIC-PREBIOTIC PO) Take by mouth daily.    [provider]   Barberry-Oreg Grape-Goldenseal (BERBERINE COMPLEX PO) Take by mouth.    [provider]  ibuprofen  (ADVIL ) 800 MG tablet Take 1 tablet (800 mg total) by mouth every 8 (eight) hours as needed. 01/28/20   Shivaji, Lavonia HERO, MD  levothyroxine  (SYNTHROID ) 88 MCG tablet Take 1 tablet (88 mcg total) by mouth every morning. 03/31/24   Nicholaus Credit, PA-C  MAGNESIUM GLYCINATE PO Take by mouth.    [provider]  omega-3 acid ethyl esters (LOVAZA) 1 g capsule Take 1 g by mouth daily.    [provider]  VITAMIN D  PO Take by mouth.    [provider]    Family History Family History  Problem Relation Age of Onset   Bipolar disorder Mother    Fibroids Mother        embedded fibroids and ovarian cyst   Thyroid  disease Mother    Hypertension Mother        ?   Bipolar disorder Brother    Diabetes Maternal Grandmother        prediabetic   Endometriosis Maternal Grandmother    Diabetes Paternal  Grandfather    Prostate cancer Paternal Grandfather    Heart disease Paternal Grandfather    Breast cancer Other        maternal great grandmother    Social History Social History[1]   Allergies   Banana, Banana extract allergy skin test, Nitrofurantoin, and Beeswax   Review of Systems Review of Systems   Physical Exam Triage Vital Signs ED Triage Vitals  Encounter Vitals Group     BP 08/25/24 1809 132/85     Girls Systolic BP Percentile --      Girls Diastolic BP Percentile --      Boys Systolic BP Percentile --      Boys Diastolic BP Percentile --      Pulse Rate 08/25/24 1809 84     Resp 08/25/24 1809 20     Temp 08/25/24 1809 98.4 F (36.9 C)     Temp Source 08/25/24 1809 Oral     SpO2 08/25/24 1809 96 %     Weight --      Height --      Head Circumference --      Peak Flow --      Pain Score 08/25/24 1807 4     Pain Loc --      Pain Education --      Exclude from Growth Chart --    No data found.  Updated Vital Signs BP 132/85 (BP  Location: Right Arm)   Pulse 84   Temp 98.4 F (36.9 C) (Oral)   Resp 20   LMP 08/09/2024 (Exact Date)   SpO2 96%   Visual Acuity Right Eye Distance:   Left Eye Distance:   Bilateral Distance:    Right Eye Near:   Left Eye Near:    Bilateral Near:     Physical Exam   UC Treatments / Results  Labs (all labs ordered are listed, but only abnormal results are displayed) Labs Reviewed - No data to display  EKG   Radiology No results found.  Procedures Procedures (including critical care time)  Medications Ordered in UC Medications - No data to display  Initial Impression / Assessment and Plan / UC Course  I have reviewed the triage vital signs and the nursing notes.  Pertinent labs & imaging results that were available during my care of the patient were reviewed by me and considered in my medical decision making (see chart for details).     *** Final Clinical Impressions(s) / UC Diagnoses   Final diagnoses:  None   Discharge Instructions   None    ED Prescriptions   None    PDMP not reviewed this encounter.    [1]  Social History Tobacco Use   Smoking status: Never   Smokeless tobacco: Never  Substance Use Topics   Alcohol use: Not Currently    Alcohol/week: 1.0 - 3.0 standard drink of alcohol    Types: 1 - 3 Standard drinks or equivalent per week   Drug use: No   "

## 2024-08-31 ENCOUNTER — Telehealth: Payer: Self-pay | Admitting: Physician Assistant

## 2024-08-31 NOTE — Telephone Encounter (Signed)
 Called and left a voicemail as well as sent a MyChart message for patient to give the office a call to reschedule the appointment for 09/14/24 due to the provider being out of office.

## 2024-09-14 ENCOUNTER — Encounter: Payer: Self-pay | Admitting: Physician Assistant
